# Patient Record
Sex: Female | Born: 1935 | Race: White | Hispanic: No | Smoking: Never smoker
Health system: Southern US, Community
[De-identification: ages and names within clinical notes are randomized; demographics above are authoritative.]

## PROBLEM LIST (undated history)

## (undated) DIAGNOSIS — I10 Essential (primary) hypertension: Secondary | ICD-10-CM

## (undated) DIAGNOSIS — I219 Acute myocardial infarction, unspecified: Secondary | ICD-10-CM

## (undated) DIAGNOSIS — K5090 Crohn's disease, unspecified, without complications: Secondary | ICD-10-CM

## (undated) DIAGNOSIS — K449 Diaphragmatic hernia without obstruction or gangrene: Secondary | ICD-10-CM

## (undated) DIAGNOSIS — J45909 Unspecified asthma, uncomplicated: Secondary | ICD-10-CM

## (undated) HISTORY — PX: ABDOMINAL HYSTERECTOMY: SHX81

## (undated) HISTORY — DX: Essential (primary) hypertension: I10

## (undated) HISTORY — DX: Crohn's disease, unspecified, without complications: K50.90

## (undated) HISTORY — DX: Unspecified asthma, uncomplicated: J45.909

## (undated) HISTORY — PX: APPENDECTOMY: SHX54

## (undated) HISTORY — PX: HERNIA REPAIR: SHX51

## (undated) HISTORY — DX: Diaphragmatic hernia without obstruction or gangrene: K44.9

---

## 1998-03-23 ENCOUNTER — Inpatient Hospital Stay (HOSPITAL_COMMUNITY): Admission: EM | Admit: 1998-03-23 | Discharge: 1998-03-25 | Payer: Self-pay | Admitting: Emergency Medicine

## 1998-03-25 ENCOUNTER — Encounter: Payer: Self-pay | Admitting: Cardiovascular Disease

## 1998-04-21 ENCOUNTER — Encounter: Payer: Self-pay | Admitting: Gastroenterology

## 1998-04-21 ENCOUNTER — Ambulatory Visit (HOSPITAL_COMMUNITY): Admission: RE | Admit: 1998-04-21 | Discharge: 1998-04-21 | Payer: Self-pay | Admitting: Gastroenterology

## 2000-04-21 ENCOUNTER — Ambulatory Visit (HOSPITAL_COMMUNITY): Admission: RE | Admit: 2000-04-21 | Discharge: 2000-04-21 | Payer: Self-pay | Admitting: Gastroenterology

## 2000-04-21 ENCOUNTER — Encounter: Payer: Self-pay | Admitting: Gastroenterology

## 2000-09-05 ENCOUNTER — Encounter: Payer: Self-pay | Admitting: Family Medicine

## 2000-09-05 ENCOUNTER — Encounter: Admission: RE | Admit: 2000-09-05 | Discharge: 2000-09-05 | Payer: Self-pay | Admitting: Family Medicine

## 2002-04-18 ENCOUNTER — Inpatient Hospital Stay (HOSPITAL_COMMUNITY): Admission: EM | Admit: 2002-04-18 | Discharge: 2002-04-21 | Payer: Self-pay | Admitting: Emergency Medicine

## 2002-04-18 ENCOUNTER — Encounter: Payer: Self-pay | Admitting: Internal Medicine

## 2002-04-18 ENCOUNTER — Encounter: Payer: Self-pay | Admitting: Emergency Medicine

## 2002-04-19 ENCOUNTER — Encounter (INDEPENDENT_AMBULATORY_CARE_PROVIDER_SITE_OTHER): Payer: Self-pay | Admitting: Specialist

## 2002-04-19 ENCOUNTER — Encounter: Payer: Self-pay | Admitting: Internal Medicine

## 2002-04-19 ENCOUNTER — Encounter (INDEPENDENT_AMBULATORY_CARE_PROVIDER_SITE_OTHER): Payer: Self-pay | Admitting: *Deleted

## 2002-04-19 DIAGNOSIS — K2990 Gastroduodenitis, unspecified, without bleeding: Secondary | ICD-10-CM

## 2002-04-19 DIAGNOSIS — K2980 Duodenitis without bleeding: Secondary | ICD-10-CM | POA: Insufficient documentation

## 2002-04-19 DIAGNOSIS — K2970 Gastritis, unspecified, without bleeding: Secondary | ICD-10-CM | POA: Insufficient documentation

## 2002-04-20 ENCOUNTER — Encounter: Payer: Self-pay | Admitting: Internal Medicine

## 2002-04-20 DIAGNOSIS — K5730 Diverticulosis of large intestine without perforation or abscess without bleeding: Secondary | ICD-10-CM | POA: Insufficient documentation

## 2002-04-20 DIAGNOSIS — K644 Residual hemorrhoidal skin tags: Secondary | ICD-10-CM | POA: Insufficient documentation

## 2002-04-21 ENCOUNTER — Encounter: Payer: Self-pay | Admitting: Internal Medicine

## 2002-09-14 ENCOUNTER — Encounter: Payer: Self-pay | Admitting: Family Medicine

## 2002-09-14 ENCOUNTER — Encounter: Admission: RE | Admit: 2002-09-14 | Discharge: 2002-09-14 | Payer: Self-pay | Admitting: Family Medicine

## 2002-09-22 ENCOUNTER — Encounter: Payer: Self-pay | Admitting: Cardiovascular Disease

## 2003-09-28 ENCOUNTER — Encounter: Admission: RE | Admit: 2003-09-28 | Discharge: 2003-09-28 | Payer: Self-pay | Admitting: Family Medicine

## 2004-08-28 ENCOUNTER — Ambulatory Visit: Payer: Self-pay | Admitting: Gastroenterology

## 2005-11-04 ENCOUNTER — Ambulatory Visit: Payer: Self-pay | Admitting: Gastroenterology

## 2006-11-24 ENCOUNTER — Ambulatory Visit: Payer: Self-pay | Admitting: Gastroenterology

## 2007-01-01 ENCOUNTER — Encounter: Admission: RE | Admit: 2007-01-01 | Discharge: 2007-01-01 | Payer: Self-pay | Admitting: Family Medicine

## 2007-11-18 ENCOUNTER — Ambulatory Visit: Payer: Self-pay | Admitting: Gastroenterology

## 2007-11-18 DIAGNOSIS — K5090 Crohn's disease, unspecified, without complications: Secondary | ICD-10-CM | POA: Insufficient documentation

## 2007-11-18 DIAGNOSIS — K222 Esophageal obstruction: Secondary | ICD-10-CM | POA: Insufficient documentation

## 2007-11-18 LAB — CONVERTED CEMR LAB
ALT: 14 units/L (ref 0–35)
AST: 17 units/L (ref 0–37)
Albumin: 3.8 g/dL (ref 3.5–5.2)
Alkaline Phosphatase: 74 units/L (ref 39–117)
Basophils Absolute: 0 10*3/uL (ref 0.0–0.1)
CO2: 30 meq/L (ref 19–32)
Chloride: 110 meq/L (ref 96–112)
Creatinine, Ser: 0.9 mg/dL (ref 0.4–1.2)
Eosinophils Absolute: 0.1 10*3/uL (ref 0.0–0.7)
Eosinophils Relative: 1.5 % (ref 0.0–5.0)
GFR calc non Af Amer: 65 mL/min
HCT: 40 % (ref 36.0–46.0)
MCHC: 33.5 g/dL (ref 30.0–36.0)
MCV: 87.2 fL (ref 78.0–100.0)
Neutro Abs: 4.9 10*3/uL (ref 1.4–7.7)
Platelets: 202 10*3/uL (ref 150–400)
RDW: 12.9 % (ref 11.5–14.6)
Sodium: 144 meq/L (ref 135–145)
Total Bilirubin: 0.7 mg/dL (ref 0.3–1.2)
Total Protein: 7.4 g/dL (ref 6.0–8.3)

## 2009-04-03 ENCOUNTER — Ambulatory Visit: Payer: Self-pay | Admitting: Gastroenterology

## 2010-05-14 ENCOUNTER — Ambulatory Visit: Payer: Self-pay | Admitting: Internal Medicine

## 2010-05-14 ENCOUNTER — Ambulatory Visit: Payer: Self-pay

## 2010-05-14 ENCOUNTER — Encounter: Payer: Self-pay | Admitting: Cardiovascular Disease

## 2010-05-14 ENCOUNTER — Ambulatory Visit (HOSPITAL_COMMUNITY)
Admission: RE | Admit: 2010-05-14 | Discharge: 2010-05-14 | Payer: Self-pay | Source: Home / Self Care | Admitting: Cardiovascular Disease

## 2010-11-27 NOTE — Assessment & Plan Note (Signed)
[facility]                         GASTROENTEROLOGY OFFICE NOTE   NAME:Varady, Sherrika W                          MRN:          [id number]  DATE:11/24/2006                            DOB:          1931/03/29    PROBLEM:  Crohn's disease.   Kerri Sosa has returned for an annual visit.  She has no GI complaints.  Specifically, she is without change of bowel habits or abdominal pain.  She has a history of esophageal stricture but denies dysphagia.   MEDICATIONS:  1. Prilosec.  2. Doxazosin.  3. Asacol 800 mg t.i.d.  4. Benicar.  5. Actonel.  6. Calcium.   ALLERGIES:  NO KNOWN DRUG ALLERGIES.   PHYSICAL EXAMINATION:  VITAL SIGNS:  Pulse 90, blood pressure 158/90,  weight 160.  HEENT:  EOMI. PERRLA. Sclerae are anicteric.  Conjunctivae are pink.  NECK:  Supple without thyromegaly, adenopathy or carotid bruits.  CHEST:  Clear to auscultation and percussion without adventitious  sounds.  CARDIAC:  Regular rhythm; normal S1 S2.  There are no murmurs, gallops  or rubs.  ABDOMEN:  Bowel sounds are normoactive.  Abdomen is soft, non-tender and  non-distended.  There are no abdominal masses, tenderness, splenic  enlargement or hepatomegaly.  EXTREMITIES:  Full range of motion.  No cyanosis, clubbing or edema.  RECTAL:  Deferred.   IMPRESSION:  1. Crohn's disease involving the terminal ileum - in remission.  2. History of esophageal stricture.   RECOMMENDATION:  Continue current medications.     Barbette Hair. Arlyce Dice, MD,FACG  Electronically Signed    RDK/MedQ  DD: 11/24/2006  DT: 11/24/2006  Job #: [id number]   cc:   Marjory Lies, M.D.

## 2010-11-30 NOTE — H&P (Signed)
NAME:  Kerri Sosa, Kerri Sosa                             ACCOUNT NO.:  [id number]   MEDICAL RECORD NO.:  [id number]                   PATIENT TYPE:  INP   LOCATION:  5735                                 FACILITY:  [facility]   PHYSICIAN:  Hedwig Morton. Juanda Sosa, M.D. [facility]            DATE OF BIRTH:  October 28, 1930   DATE OF ADMISSION:  04/18/2002  DATE OF DISCHARGE:                                HISTORY & PHYSICAL   CHIEF COMPLAINT:  Dizziness and weakness with microcytic anemia.   HISTORY OF PRESENT ILLNESS:  The patient is a pleasant 77 year old white  female who has about a nine year history of Crohn's disease, worked up by  Dr. Arlyce Dice in the past. She also has a history of hiatal hernia and what  sounds like esophageal strictures, as she has had a couple of occasions  where  she has had strictures dilated. She also has a history of anemia and  heme positive stools during an admission about four years ago. This is the  last time she had a colonoscopy, and at that time she also  had an upper  endoscopy. We do not have any of the prior admission records, nor do we have  any of the prior colonoscopy, endoscopy records at the time of this  dictation.   Crohn's disease wise, she does take Pentasa 250 mg, 12 a day, divided into  three doses. She also treats her Crohn's disease occasionally with p.r.n.  prednisone, which she had not done for about six months. When she started to  have lower abdominal cramping, which may or may not be associated with  increased frequency of loose stools, she takes 20 mg of prednisone and  tapers it rapidly off within a week.   Within the last three to four weeks, the patient has been having  progressive weakness and fatigue with activities that normally were not a  problem for  her, such as standing up to do the dishes or cleaning the  house. At times she has become presyncopal. Yesterday this occurred and her  family became alarmed. For the first time she also became queasy.  She was  not diaphoretic. The EMS was called and transported the patient to the  emergency room, where her blood pressure was 134/93, pulse of 70,  temperature 98.1, and hemoglobin importantly was 7.3 with an MCV of 61.8.  Cardiac enzymes and EKG were negative. Dr. Juanda Sosa admitted the patient and  ordered a transfusion of packed red blood cells, a total of  2 units. She  also set her up for an upper endoscopy this morning, which is to be  performed by Dr. Iva Boop.   ALLERGIES:  No known drug allergies.   CURRENT MEDICATIONS:  1. Pentasa 250 mg 4 pills q.i.d.  2. Univasc 15 mg 1 p.o. q.d.  3. Some form of combination antihypertensive, name and dose of which  she is     unclear, but she takes it once a day.  4. Vioxx p.r.n., about once a month. When she takes Vioxx she also takes     Nexium 40 mg at the same time, again only about once a month.   PAST MEDICAL HISTORY:  1. Crohn's disease, quiescent.  2. Hiatal hernia, GERD, question esophageal stricture.  3. Status post total abdominal hysterectomy when she was in her 75s     secondary to dysfunctional menstrual bleeding.  4. Status post right inguinal hernia repair.  5. Hypertension.  6. Degenerative joint disease.  7. History of anemia and heme positive stools about four years ago,     requiring transfusions.   FAMILY HISTORY:  No family history of inflammatory bowel  disease. No family  history or anemia or GI bleed.   SOCIAL HISTORY:  She has been a widow for 15 years. She has seven children,  many grandchildren and some great grandchildren. 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 She does not smoke or drink and never has. She lives in  [place].   REVIEW OF SYSTEMS:  NEUROLOGIC:  No confusion, no history of strokes,  no  focal weakness.  PULMONARY:  Sometimes she has a dry cough. She has had some  shortness of breath with this  recent fatigue but no history of COPD.  CARDIOVASCULAR:  No exertional angina. She does have some esophageal  burning, but that is  associated with eating when she gets filled up. GI:  Does describe some early satiety but no weight loss. No change in bowel  habits, no recent bloody bowel movements. Stools tend to be loose. GU: No  bladder incontinence. Urinates at night about two to three times.  MUSCULOSKELETAL:  Has some pain in her hips. In fact she has had cortisone  injections to the hips.  SKIN:  No history of skin cancers, rashes.  All  other systems were reviewed and were negative.   PHYSICAL EXAMINATION:  VITAL SIGNS:  Blood pressure 134/93,  pulse 70,  respirations 20, temperature 98.1.  GENERAL:  The patient is  a pleasant, pale appearing white female. She is  alert and oriented and provides a good history.  HEENT:  Sclerae anicteric. No conjunctival pallor. Extraocular movements  intact. Oropharynx, she has dentures above. A few teeth remain in her lower  jaw. Moist and pink mucous membranes.  NECK:  No JVD, thyromegaly or masses.  CHEST:  Clear to auscultation and percussion bilaterally. No cough with deep  inspiration.  COR:  There is a 1/6 systolic ejection murmur and the rate is rapid. No  gallops.  ABDOMEN:  Soft, nondistended, bowel sounds are active. There is some mild  tenderness in the right lower quadrant. No hepatosplenomegaly or masses. No  bruits.  RECTAL:  Per the P.A. in the emergency department is Guaiac negative brown  stool, no masses.  EXTREMITIES:  No cyanosis, clubbing or edema.  NEUROLOGIC:  She is alert and oriented x 3, no tremors. Grip is 5/5, pedal  strength is 5/5.  MUSCULOSKELETAL:  There are some degenerative changes in the fingers and  hands, but no gross musculoskeletal  deformities of the  spine or knees.  DERMATOLOGIC:  No spider telangiectasias on the trunk or upper extremities.  LABORATORY DATA:  Hemoglobin 7.3, hematocrit 24.3, MCV  61.8, platelet  334,000, WBC count 5.9.  Sed rate 22. Sodium 143, potassium 4.2, BUN 12,  creatinine 0.9, glucose 99. Total bilirubin  0.4, alkaline phosphatase 85,  AST 13, ALT 11, albumin  2.9. Iron low at 12, total iron binding capacity  normal at 336, iron saturation 4%, B12 normal 353. Serum folate level 12.4,  normal. CK 118, CK-MB 1.5, troponin I 0.01.   IMPRESSION:  1. Severe microcytic anemia, probably secondary to chronic GI blood loss,     likely due to Crohn's disease, but need to rule out peptic ulcer disease     or other upper GI mucosal abnormality. Arteriovenous malformations could     also cause chronic occult blood loss.  2. History of hiatal hernia and what sounds like an esophageal stricture. At     present she is not having  any symptomatic dysphagia but does have early     satiety.  3. Questionable history of peptic ulcer disease, at least 40 years ago.    PLAN:  The patient is to be upper endoscoped by Dr. Iva Boop. Also  ordered was a CT scan of the abdomen and pelvis with oral and IV contrast to  evaluate  the small and large  intestine, given her history of Crohn's  disease.  The patient will require iron supplementation. The patient also  may  require repeat colonoscopy. Plan to hold Vioxx and blood pressure  medications, although she has not been  using a lot of Vioxx.     Brett Canales, P.A. [facility]                    Kerri Sosa, M.D. [facility]    SG/MEDQ  D:  04/19/2002  T:  04/19/2002  Job:  [id number]

## 2010-11-30 NOTE — Procedures (Signed)
[facility], [place]  Patient:    Kerri Sosa, Kerri Sosa                      MRN: [id number] Proc. Date: 04/21/00 Adm. Date:  [id number] Attending:  Judeth Cornfield CC:         Marjory Lies, M.D.   Procedure Report  HISTORY:  Ms. Lambeth has a history of Crohns disease.  She also has a history of an esophageal stricture for which she was dilated two years ago. She has recurrent dysphagia.  INFORMED CONSENT:  The patient provided consent after the risks, benefits and alternatives were explained.  MEDICATIONS: Versed 3 mg, Vanceril 30 mg, Robinul 0.2 mg IV and Cetacaine spray.  PROCEDURE:  The patient was placed in the left lateral decubitus position and administered continuous low flow oxygen and was placed on pulse oximetry.  The Olympus video gastroscope was inserted under direct vision into the oropharynx and the esophagus.  FINDINGS:  Again noted at the GE junction was a fibrotic stricture.  The remainder of the exam was normal including the proximal esophagus, stomach and duodenum.  THERAPEUTICS:  With the scope in the distal stomach a  guide wire was placed through the scope and the scope was withdrawn.  Under fluoroscopic guidance #16, 17 and 18 mm Savary dilators were passed with resistance.  IMPRESSION:  Recurrent esophageal stricture.  RECOMMENDATION:  Repeat dilatation p.r.n. DD:  04/21/00 TD:  04/22/00 Job: [id number]

## 2010-11-30 NOTE — Discharge Summary (Signed)
Kerri Sosa, Kerri Sosa                             ACCOUNT NO.:  [id number]   MEDICAL RECORD NO.:  [id number]                   PATIENT TYPE:  INP   LOCATION:  5735                                 FACILITY:  [facility]   PHYSICIAN:  Teena Irani. Leone Payor, M.D.              DATE OF BIRTH:  10-18-1930   DATE OF ADMISSION:  04/18/2002  DATE OF DISCHARGE:                                 DISCHARGE SUMMARY   ADMISSION DIAGNOSES:  1. Severe microcytic anemia probably secondary to chronic gastrointestinal     blood loss associated with Crohn's disease, however, need to rule out     peptic ulcer disease or other upper gastrointestinal mucosal     abnormalities, rule out arteriovenous malformations as source of chronic     blood loss.  2. History of hiatal hernia and gastroesophageal reflux disease status post     dilations of the esophagus, not clear that she actually had stricture,     however.  3. Questionable history of peptic ulcer disease remotely approximately 40     years prior.  Rule out recurrence causing gastrointestinal bleed and     subsequent anemia.  4. Crohn's disease, generally asymptomatic with periodic diarrhea on stable     dose of Pentasa.  5. Status post total abdominal hysterectomy secondary to dysfunctional     menstrual bleeding when the patient was in her forties.  6. Status post right inguinal hernia repair.  7. Hypertension.  8. Degenerative joint disease.  9. Prior history of anemia with heme positive stools requiring transfusions     about four years previously.   DISCHARGE DIAGNOSES:  1. Gastrointestinal bleed secondary to [facility] erosions associated with     large hiatal hernia.  2. Anemia requiring transfusion with two units of packed red blood cells.     This is secondary to chronic gastrointestinal loss associated with     Sheria Lang erosions.  She is iron deficient with a low MCV.  3. History of Crohn's ileitis, stable on Pentasa.  Sedimentation rate is     normal  at 22.  4. Probable esophageal stricture.  The patient has intermittent dysmotility     and may benefit from future repeat dilatation of the esophagus.   CONSULTATIONS:  None.   PROCEDURE:  The patient underwent upper endoscopy on April 19, 2002 by Dr.  Stan Head.  He saw a partially obstructing stricture in the distal  esophagus which appeared benign.  Also encountered was a prolapsing hiatal  hernia with associated Cameron erosions and questionable paraesophageal  component.  Erosive gastritis in the body of the stomach noted, this in the  area of the large hiatal hernia.  Erosions were noted in the duodenum as  well.  CLOtest was performed and results were negative for Helicobacter  pylori.   BRIEF HISTORY:  The patient is a nice 77 year old white female with  about a  nine year history of Crohn's disease followed by Dr. Arlyce Dice.  She has had a  hiatal hernia and what sounds like esophageal strictures which have been  dilated in the past.  Previous colonoscopy and upper endoscopy had been at  least three years ago.  She takes a stable dose of Pentasa at 250 mg p.o.  t.i.d.  She also occasionally goes on very brief tapers of prednisone when  diarrhea flares and these tapers start at 20 mg of prednisone dialy and  taper rapidly to discontinuation over the next five to seven days.  Occasionally with these diarrheal flares she has some cramping lower  abdominal pain.  However, her stools have been stable and mostly formed  lately.  In the last four weeks prior to admission the patient was having  fatigue and weakness with normal activity such as doing her dishes or  cleaning the house.  She had had some pre-syncope as well.  On the day prior  to admission she became markedly pre-syncopal and the family became alarmed  and contacted EMS.  Blood pressure and pulse were stable.  When she arrived  in the emergency room her hemoglobin was 7.3 and MCV was low.  Cardiac  enzymes and EKG were  negative.  She was evaluated by Dr. Lina Sar who  ordered transfusion of two units of packed red blood cells and set her up  for endoscopy later that morning.   LABORATORY DATA:  Surgical pathology from the duodenum showed benign  duodenal mucosa and no active mucosal inflammation with some focal  superficial hemorrhages in the lamina propria.   Hemoglobin initially was 7.3 with a hematocrit of 24.3, MCV 61.8, platelets  334,000 and white blood cell count of 5.9.  Final hemoglobin was 9.4 and  hematocrit was 30.9.  Sedimentation rate was 22.  Sodium 143, potassium 4.2,  BUN 12, creatinine 0.9, glucose 99.  Albumin 2.9, total bilirubin 0.4,  alkaline phosphatase 85, AST 13, ALT 11.  CK was 118, CK MB 1.5, Troponin I  0.01.  Iron was low at 12, total iron binding capacity 336, iron saturation  4% which is low, B12 level 353, folate level 12.4.  I do not see a ferritin  level.  Urease testing for the presence of Helicobacter was negative.   RADIOLOGY:  A single view portable chest showed some mild cardiac  enlargement without acute pulmonary process.  CT scan of the abdomen and  pelvis showed a large hiatal hernia with a cyst in the right lobe of the  liver unchanged compared with 1994 CT scan.  The CT scan of the pelvis  showed bowel wall thickening affecting the distal ileum consistent with  Crohn's ileitis.  No complications noted.  There was diverticulosis without  evidence of diverticulitis as well.  Upper GI series showed a large hiatal  hernia without any paraesophageal component or volvulus.  Also seen was a  nonspecific esophageal dysmotility, but no esophageal stricture visualized.   HOSPITAL COURSE:  The patient was admitted to a floor bed. She received  transfusions times two units between the emergency room and arrival to the  floor.  Once he was transfused, serial hemoglobins and hematocrits were obtained and his hemoglobin and hematocrit remained stable.   Upper  endoscopy was performed by Dr. Leone Payor with findings noted above.  He  felt that he may have possibly reduced some of the hiatal hernia intubating  the patient with the endoscope and was concerned  that she may have a  paraesophageal component to her hiatal hernia.  He did find multiple Cameron  erosions in the sac of the hiatal hernia and was fairly certain that this  was the cause for her chronic GI blood loss and anemia.  The day following  her upper endoscopy the patient underwent upper GI series which indeed did  confirm a large hiatal hernia but no worrisome component such as  paraesophageal hernia oro volvulus.  Therefore, the current measures in  place for treatment, which included protein pump inhibitor therapy and  initiation of iron therapy were to be continued as an outpatient.   Regarding esophageal stricture, Dr. Leone Payor did not have equipment available  to safely dilate the esophagus at the time of the upper endoscopy, but he  does feel that the patient may benefit from attempted dilation of the  esophagus at a future point.  She has intermittent dysphagia, but also  recalled that the upper GI series showed that she had some component of  nonspecific esophageal dysmotility.   The patient's vital signs were stable throughout her hospitalization.  She  was very pleasant to deal with. After her upper endoscopy her diet was  advanced to solid foods which she tolerated without problem.  She was felt  stable for discharge on the afternoon of October 8 after completing the  upper GI series.   DISCHARGE MEDICATIONS:  1. Pentasa 240 mg four p.o. t.i.d.  2. Univasc 15 mg one p.o. q.d.  3. Doxazosin 4 mg one p.o. q.d.  4. Vioxx (dose is not known) as needed one p.o. q.d.  5. New medications include Prilosec 20 mg one p.o. q.d. which she is to     start after she finishes up her existing supply of Nexium 40 mg which she     is to begin taking on a daily basis rather than on a p.r.n.  basis; iron     sulfate 325 mg one p.o. b.i.d. and multivitamin senior type formulation     one p.o. q.d.   FOLLOW UP:  1. A return office visit is made for the patient on Friday, November 14 at     1:30 p.m. with Dr. Arlyce Dice.  She or her family are to call the office if     she is having problems prior to her GI tract before this.  2. Follow-up with Dr. Doristine Counter at [facility] who is her     primary care physician and is to see her for any other medical issues.     Brett Canales, P.A. [facility]                    Teena Irani. Leone Payor, M.D.    SG/MEDQ  D:  04/21/2002  T:  04/24/2002  Job:  [id number]   cc:   Marjory Lies, MD   Teena Irani. Arlyce Dice, M.D.

## 2011-08-29 ENCOUNTER — Telehealth: Payer: Self-pay | Admitting: Cardiovascular Disease

## 2011-08-29 NOTE — Telephone Encounter (Signed)
Kerri Sosa with dr Lyn Hollingshead morris's office needs to know if pt needs an abx before dental appt may be tooth extraction, pls fax response to [facility] @ [phone]

## 2011-08-29 NOTE — Telephone Encounter (Signed)
Faxed reply no abx needed per Dr Elease Hashimoto

## 2011-11-20 ENCOUNTER — Encounter: Payer: Self-pay | Admitting: *Deleted

## 2011-11-20 DIAGNOSIS — K449 Diaphragmatic hernia without obstruction or gangrene: Secondary | ICD-10-CM | POA: Insufficient documentation

## 2012-01-01 ENCOUNTER — Other Ambulatory Visit: Payer: Self-pay | Admitting: Family Medicine

## 2012-01-01 DIAGNOSIS — Z1231 Encounter for screening mammogram for malignant neoplasm of breast: Secondary | ICD-10-CM

## 2012-01-09 ENCOUNTER — Other Ambulatory Visit: Payer: Self-pay | Admitting: Cardiovascular Disease

## 2012-01-22 ENCOUNTER — Ambulatory Visit
Admission: RE | Admit: 2012-01-22 | Discharge: 2012-01-22 | Disposition: A | Discharge status: Home / Self Care | Payer: Medicare Other | Source: Ambulatory Visit | Attending: Family Medicine | Admitting: Family Medicine

## 2012-01-22 DIAGNOSIS — Z1231 Encounter for screening mammogram for malignant neoplasm of breast: Secondary | ICD-10-CM

## 2012-06-25 ENCOUNTER — Ambulatory Visit (INDEPENDENT_AMBULATORY_CARE_PROVIDER_SITE_OTHER): Payer: Medicare Other | Admitting: Cardiovascular Disease

## 2012-06-25 ENCOUNTER — Encounter: Payer: Self-pay | Admitting: Cardiovascular Disease

## 2012-06-25 VITALS — BP 132/90 | HR 79 | Ht 64.0 in | Wt 159.4 lb

## 2012-06-25 DIAGNOSIS — I350 Nonrheumatic aortic (valve) stenosis: Secondary | ICD-10-CM

## 2012-06-25 DIAGNOSIS — I359 Nonrheumatic aortic valve disorder, unspecified: Secondary | ICD-10-CM

## 2012-06-25 NOTE — Assessment & Plan Note (Signed)
Kerri Sosa presents for further evaluation and management of her aortic stenosis. She has no specific symptoms of aortic stenosis. She does have some dyspnea but I don't think that it is related to her aortic stenosis. She denies any syncope or chest pain.  We'll get a repeat echocardiogram this week. Her last echo was in 2011. I anticipate seeing her again in one year for followup office visit.  I have asked her to get exercise on a regular basis.

## 2012-06-25 NOTE — Patient Instructions (Addendum)
Your physician has requested that you have an echocardiogram today if can. Echocardiography is a painless test that uses sound waves to create images of your heart. It provides your doctor with information about the size and shape of your heart and how well your heart's chambers and valves are working. This procedure takes approximately one hour. There are no restrictions for this procedure.  Your physician wants you to follow-up in:1 year  You will receive a reminder letter in the mail two months in advance. If you don't receive a letter, please call our office to schedule the follow-up appointment.

## 2012-06-25 NOTE — Progress Notes (Signed)
    Kerri Sosa Date of Birth  December 15, 1930       [facility]    [place] [address]  [address] [place], [place]  [address]   [place], [place]  [address] [phone]     [phone]   Fax  [phone]    Fax [phone]  Problem List: 1. Dyspnea 2. Aortic stenosis - mild 3. Mitral regurgitation  History of Present Illness:  Kerri Sosa is a 78 yo with hx of mild AS and MR.  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  She does not get any regular exercise.  She denies any syncopal episodes. She denies any chest pain.  Current Outpatient Prescriptions on File Prior to Visit  Medication Sig Dispense Refill  . amLODipine-valsartan (EXFORGE) 5-160 MG per tablet Take 1 tablet by mouth daily.      . Calcium Carbonate-Vitamin D (CALCIUM + D PO) Take 600 mg by mouth daily.      Marland Kitchen omeprazole (PRILOSEC) 20 MG capsule Take 20 mg by mouth daily.      . moexipril (UNIVASC) 15 MG tablet Take 15 mg by mouth at bedtime.        No Known Allergies  Past Medical History  Diagnosis Date  . Hiatal hernia   . Crohn's disease   . Hypertension     Past Surgical History  Procedure Date  . Hernia repair   . Appendectomy     History  Smoking status  . Never Smoker   Smokeless tobacco  . Not on file    History  Alcohol Use No    Family History  Problem Relation Age of Onset  . Heart disease Mother   . Cancer Father     Reviw of Systems:  Reviewed in the HPI.  All other systems are negative.  Physical Exam: Blood pressure 132/90, pulse 79, height 5\' 4"  (1.626 m), weight 159 lb 6.4 oz (72.303 kg), SpO2 94.00%. General: Well developed, well nourished, in no acute distress.  Head: Normocephalic, atraumatic, sclera non-icteric, mucus membranes are moist,   Neck: Supple. Carotids are 2 + .  There is radiation of his systolic murmur up into his right carotid-versus a right carotid bruit.  No JVD   Lungs: Clear   Heart: RR, 2/6  systolic murmur  Abdomen: Soft, non-tender, non-distended with normal bowel sounds.  Msk:  Strength and tone are normal   Extremities: No clubbing or cyanosis. No edema.  Distal pedal pulses are 2+ and equal    Neuro: CN II - XII intact.  Alert and oriented X 3.   Psych:  Normal   ECG: June 25, 2012-normal sinus rhythm at 65 beats a minute. She has occasional premature atrial contractions. She has voltage for left ventricular hypertrophy.  Assessment / Plan:

## 2012-07-17 ENCOUNTER — Ambulatory Visit (HOSPITAL_COMMUNITY): Payer: Medicare Other | Attending: Cardiovascular Disease

## 2012-07-17 DIAGNOSIS — I10 Essential (primary) hypertension: Secondary | ICD-10-CM | POA: Insufficient documentation

## 2012-07-17 DIAGNOSIS — I350 Nonrheumatic aortic (valve) stenosis: Secondary | ICD-10-CM

## 2012-07-17 DIAGNOSIS — I369 Nonrheumatic tricuspid valve disorder, unspecified: Secondary | ICD-10-CM | POA: Insufficient documentation

## 2012-07-17 DIAGNOSIS — I359 Nonrheumatic aortic valve disorder, unspecified: Secondary | ICD-10-CM

## 2012-07-17 DIAGNOSIS — I059 Rheumatic mitral valve disease, unspecified: Secondary | ICD-10-CM | POA: Insufficient documentation

## 2012-07-17 DIAGNOSIS — I379 Nonrheumatic pulmonary valve disorder, unspecified: Secondary | ICD-10-CM | POA: Insufficient documentation

## 2012-07-17 NOTE — Progress Notes (Signed)
Echocardiogram performed.  

## 2012-12-02 ENCOUNTER — Other Ambulatory Visit: Payer: Self-pay | Admitting: Family Medicine

## 2012-12-02 DIAGNOSIS — Z1231 Encounter for screening mammogram for malignant neoplasm of breast: Secondary | ICD-10-CM

## 2012-12-02 DIAGNOSIS — M810 Age-related osteoporosis without current pathological fracture: Secondary | ICD-10-CM

## 2013-01-26 ENCOUNTER — Ambulatory Visit
Admission: RE | Admit: 2013-01-26 | Discharge: 2013-01-26 | Disposition: A | Discharge status: Home / Self Care | Payer: Medicare Other | Source: Ambulatory Visit | Attending: Family Medicine | Admitting: Family Medicine

## 2013-01-26 DIAGNOSIS — M810 Age-related osteoporosis without current pathological fracture: Secondary | ICD-10-CM

## 2013-01-26 DIAGNOSIS — Z1231 Encounter for screening mammogram for malignant neoplasm of breast: Secondary | ICD-10-CM

## 2013-02-22 ENCOUNTER — Telehealth: Payer: Self-pay | Admitting: Cardiovascular Disease

## 2013-02-22 NOTE — Telephone Encounter (Signed)
New Problem  Pt was out of town and had a heart attack// She received a cath and they put in a stent/// pt wants an appointment as soon as possible// Daughter will have notes routed to [place].

## 2013-02-22 NOTE — Telephone Encounter (Signed)
Pt is still in the hospital and is being discharged today, pt has app set here by the hospital attending/ offered an earlier app i [facility] but she declined at this time, she will wait till she is at home and will call back if sooner app needed.

## 2013-03-11 ENCOUNTER — Telehealth: Payer: Self-pay | Admitting: *Deleted

## 2013-03-11 NOTE — Telephone Encounter (Signed)
Kerri Sosa FROM DR MORRIS OFFICE CALLED RE PT.   PT IS IN CHAIR  IN NEED   OF CLEANING  AND  FILLING  PT JUST HAD  MI  WITH  2 STENTS PER DAUGHTER  IN [place]  DISCUSSED WITH LAUREN RECOMMENDATIONS ARE TO WAIT 6 WEEKS  IF APPT  IS  ROUTINE  TO RESCHEDULE . DR MORRIS  AWARE OF ABOVE PT WILL BE RESCHEDULED FOR 6 WEEKS OUT FROM  MI./CY

## 2013-03-11 NOTE — Telephone Encounter (Signed)
NOTED ./CY 

## 2013-03-11 NOTE — Telephone Encounter (Signed)
Kerri Sosa had an MI 2 weeks ago and was seen in [facility].  I do not have any details about the MI or stent procedure.

## 2013-04-07 ENCOUNTER — Ambulatory Visit (INDEPENDENT_AMBULATORY_CARE_PROVIDER_SITE_OTHER): Payer: Medicare Other | Admitting: Cardiovascular Disease

## 2013-04-07 ENCOUNTER — Telehealth: Payer: Self-pay | Admitting: *Deleted

## 2013-04-07 ENCOUNTER — Encounter: Payer: Self-pay | Admitting: Cardiovascular Disease

## 2013-04-07 VITALS — BP 150/75 | HR 47 | Ht 64.0 in | Wt 147.0 lb

## 2013-04-07 DIAGNOSIS — R748 Abnormal levels of other serum enzymes: Secondary | ICD-10-CM

## 2013-04-07 DIAGNOSIS — I350 Nonrheumatic aortic (valve) stenosis: Secondary | ICD-10-CM

## 2013-04-07 DIAGNOSIS — I359 Nonrheumatic aortic valve disorder, unspecified: Secondary | ICD-10-CM

## 2013-04-07 DIAGNOSIS — I10 Essential (primary) hypertension: Secondary | ICD-10-CM

## 2013-04-07 DIAGNOSIS — E785 Hyperlipidemia, unspecified: Secondary | ICD-10-CM

## 2013-04-07 DIAGNOSIS — I2510 Atherosclerotic heart disease of native coronary artery without angina pectoris: Secondary | ICD-10-CM

## 2013-04-07 LAB — HEPATIC FUNCTION PANEL: Total Bilirubin: 0.7 mg/dL (ref 0.3–1.2)

## 2013-04-07 LAB — BASIC METABOLIC PANEL
BUN: 15 mg/dL (ref 6–23)
CO2: 28 mEq/L (ref 19–32)
Calcium: 9 mg/dL (ref 8.4–10.5)
Creatinine, Ser: 0.9 mg/dL (ref 0.4–1.2)
GFR: 64.85 mL/min (ref >60.00)
Glucose, Bld: 91 mg/dL (ref 70–99)

## 2013-04-07 LAB — LIPID PANEL
HDL: 67.5 mg/dL (ref >39.00)
LDL Cholesterol: 50 mg/dL (ref 0–99)
VLDL: 11 mg/dL (ref 0.0–40.0)

## 2013-04-07 NOTE — Assessment & Plan Note (Signed)
She was just recently diagnosed with coronary artery disease in August. Her LDL goal is now 43. She has been placed on Lipitor. We'll check fasting labs today.

## 2013-04-07 NOTE — Assessment & Plan Note (Signed)
Blood pressures typically normal when she measures it at home. We'll continue to follow this.

## 2013-04-07 NOTE — Patient Instructions (Addendum)
Your physician recommends that you return for lab work in: TODAY BMET LIPID LIVER  Your physician wants you to follow-up in: 6 MONTHS WITH EKG  You will receive a reminder letter in the mail two months in advance. If you don't receive a letter, please call our office to schedule the follow-up appointment.  Your physician recommends that you return for a FASTING lipid profile: 6 MONTHS

## 2013-04-07 NOTE — Assessment & Plan Note (Signed)
Her aortic stenosis is stable. She is a peak to peak gradient of 7 mm of mercury by cardiac catheterization on 02/19/2013.

## 2013-04-07 NOTE — Telephone Encounter (Signed)
Labs were reviewed// Pt to stop lipitor and tylenol/ LFT in 1 month, app given/ 05/07/13, pt verbalized understanding.

## 2013-04-07 NOTE — Progress Notes (Signed)
Kerri Sosa Date of Birth  12-24-1930       [facility]    [place] [address]  [address], [address] [place], [place]  [address]   [place], [place]  [address] [phone]     [phone]   Fax  [phone]    Fax [phone]  Problem List: 1. Dyspnea 2. Aortic stenosis - mild , for cardiac catheterization in August, 2014 revealed a peak to peak aortic valve gradient of 7 mmHg. In the past her anastomosis has been graded as mild using echo. 3. Mitral regurgitation 4. CAD - s/p Inf. STEMI, s/p BMS stenting of her mid RCA. 4.0 stent dilated to 5.0 mm. ( Aug. 8, 2014) 5. Hypertension  History of Present Illness:  Kerri Sosa is a 79 yo with hx of mild AS and MR.  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  She does not get any regular exercise.  She denies any syncopal episodes. She denies any chest pain.  Sept. 24, 2014:  Kerri Sosa was out of town and had an inferior wall ST segment elevation myocardial infarction.  She was obviously black [facility] and started having episodes of midsternal pain.   She was transferred to [facility] and taken to [facility]. She had an urgent cardiac catheterization which revealed an occluded mid right coronary artery.   She had a 4.0 x 20 mm bare-metal stent placed in her mid right coronary artery. He was post dilated up to 5.0 mm with a noncompliant balloon.  She is doing well at this point.   No CP.  She still has some dyspnea.   She's eager to get back to work. She's not had any   severe complications.  She measures her blood pressure on regular basis and her readings are typically in the normal range.  Current Outpatient Prescriptions on File Prior to Visit  Medication Sig Dispense Refill  . amLODipine-valsartan (EXFORGE) 5-160 MG per tablet Take 1 tablet by mouth daily.      . Calcium Carbonate-Vitamin D (CALCIUM + D PO) Take 600 mg by mouth daily.      Marland Kitchen omeprazole (PRILOSEC) 20 MG  capsule Take 20 mg by mouth daily.      . Multiple Vitamins-Minerals (MULTIVITAMIN PO) Take by mouth daily.       No current facility-administered medications on file prior to visit.    No Known Allergies  Past Medical History  Diagnosis Date  . Hiatal hernia   . Crohn's disease   . Hypertension     Past Surgical History  Procedure Laterality Date  . Hernia repair    . Appendectomy      History  Smoking status  . Never Smoker   Smokeless tobacco  . Not on file    History  Alcohol Use No    Family History  Problem Relation Age of Onset  . Heart disease Mother   . Cancer Father     Reviw of Systems:  Reviewed in the HPI.  All other systems are negative.  Physical Exam: Blood pressure 180/88, pulse 47, height 5\' 4"  (1.626 m), weight 147 lb (66.679 kg). General: Well developed, well nourished, in no acute distress.  Head: Normocephalic, atraumatic, sclera non-icteric, mucus membranes are moist,   Neck: Supple. Carotids are 2 + .  There is radiation of his systolic murmur up into his right carotid-versus a right carotid bruit.  No JVD  Lungs: Clear   Heart: RR, 2/6 systolic murmur  Abdomen: Soft, non-tender, non-distended with normal bowel sounds.  Msk:  Strength and tone are normal   Extremities: No clubbing or cyanosis. No edema.  Distal pedal pulses are 2+ and equal    Neuro: CN II - XII intact.  Alert and oriented X 3.   Psych:  Normal   ECG: Sept. 24, 2014:  Sinus brady at 47, occas. PACs.   Assessment / Plan:

## 2013-05-07 ENCOUNTER — Other Ambulatory Visit (INDEPENDENT_AMBULATORY_CARE_PROVIDER_SITE_OTHER): Payer: Medicare Other

## 2013-05-07 DIAGNOSIS — R748 Abnormal levels of other serum enzymes: Secondary | ICD-10-CM

## 2013-05-07 LAB — HEPATIC FUNCTION PANEL
AST: 19 U/L (ref 0–37)
Alkaline Phosphatase: 117 U/L (ref 39–117)
Bilirubin, Direct: 0.1 mg/dL (ref 0.0–0.3)
Total Bilirubin: 0.5 mg/dL (ref 0.3–1.2)
Total Protein: 7.2 g/dL (ref 6.0–8.3)

## 2013-05-11 ENCOUNTER — Telehealth: Payer: Self-pay | Admitting: Nurse Practitioner

## 2013-05-11 DIAGNOSIS — E785 Hyperlipidemia, unspecified: Secondary | ICD-10-CM

## 2013-05-11 NOTE — Telephone Encounter (Signed)
Message copied by Levi Aland on Tue May 11, 2013  4:37 PM ------      Message from: Vesta Mixer      Created: Tue May 11, 2013  4:18 PM       Her liver enzymes have returned to normal.  It's not clear to me if the liver elevations are due to the lipitor another offending medication.            OK to restart back Lipitor at 20 mg a day.  Check fasting lipids and HFP each month for 4 months. ------

## 2013-05-11 NOTE — Telephone Encounter (Signed)
I reviewed Dr. Harvie Bridge advice with patient who verbalized understanding. Patient will resume Lipitor 20 mg daily.  Patient is scheduled for lab appointments Nov, Dec, Jan, Feb and orders have been placed in Epic.

## 2013-05-25 ENCOUNTER — Telehealth: Payer: Self-pay | Admitting: *Deleted

## 2013-05-25 NOTE — Telephone Encounter (Signed)
Message copied by Antony Odea on Tue May 25, 2013 11:59 AM ------      Message from: Vesta Mixer      Created: Fri May 07, 2013  6:10 PM       Labs are great       ------

## 2013-05-25 NOTE — Telephone Encounter (Signed)
Pt already has monthly lab dates set up. msg left to disregard last msg.

## 2013-05-25 NOTE — Telephone Encounter (Signed)
Called pt to confirm she is taking lipitor and to get her in sooner for lab draw for liver enzymes// needed in 3 weeks.  Asked pt to call back, number provided.

## 2013-06-08 ENCOUNTER — Other Ambulatory Visit (INDEPENDENT_AMBULATORY_CARE_PROVIDER_SITE_OTHER): Payer: Medicare Other

## 2013-06-08 DIAGNOSIS — I350 Nonrheumatic aortic (valve) stenosis: Secondary | ICD-10-CM

## 2013-06-08 DIAGNOSIS — E785 Hyperlipidemia, unspecified: Secondary | ICD-10-CM

## 2013-06-08 DIAGNOSIS — I2510 Atherosclerotic heart disease of native coronary artery without angina pectoris: Secondary | ICD-10-CM

## 2013-06-08 DIAGNOSIS — I359 Nonrheumatic aortic valve disorder, unspecified: Secondary | ICD-10-CM

## 2013-06-08 DIAGNOSIS — I10 Essential (primary) hypertension: Secondary | ICD-10-CM

## 2013-06-08 LAB — LIPID PANEL
Cholesterol: 140 mg/dL (ref 0–200)
HDL: 85.9 mg/dL (ref >39.00)
LDL Cholesterol: 46 mg/dL (ref 0–99)
Total CHOL/HDL Ratio: 2
Triglycerides: 40 mg/dL (ref 0.0–149.0)
VLDL: 8 mg/dL (ref 0.0–40.0)

## 2013-06-08 LAB — BASIC METABOLIC PANEL
BUN: 19 mg/dL (ref 6–23)
CO2: 26 mEq/L (ref 19–32)
Calcium: 9 mg/dL (ref 8.4–10.5)
Chloride: 108 mEq/L (ref 96–112)
Creatinine, Ser: 0.9 mg/dL (ref 0.4–1.2)
Glucose, Bld: 87 mg/dL (ref 70–99)

## 2013-06-08 LAB — HEPATIC FUNCTION PANEL
ALT: 73 U/L — ABNORMAL HIGH (ref 0–35)
AST: 62 U/L — ABNORMAL HIGH (ref 0–37)
Albumin: 3.4 g/dL — ABNORMAL LOW (ref 3.5–5.2)
Alkaline Phosphatase: 301 U/L — ABNORMAL HIGH (ref 39–117)
Bilirubin, Direct: 0.1 mg/dL (ref 0.0–0.3)
Total Bilirubin: 0.6 mg/dL (ref 0.3–1.2)
Total Protein: 7.3 g/dL (ref 6.0–8.3)

## 2013-06-09 ENCOUNTER — Telehealth: Payer: Self-pay | Admitting: *Deleted

## 2013-06-09 NOTE — Telephone Encounter (Signed)
Message copied by Antony Odea on Wed Jun 09, 2013  2:04 PM ------      Message from: [place], [place] J      Created: Tue Jun 08, 2013  7:17 PM       Liver enzymes are back up - she will not be able to tolerate the Lipitor.  DC lipitor and recheck liver enzymes in 1 months.       ------

## 2013-06-09 NOTE — Telephone Encounter (Signed)
1 MO LAB DRAW MADE AND HAS OV IN 1WEEK.

## 2013-06-14 NOTE — Telephone Encounter (Signed)
lipitor was stopped/ enzymes elevated again, 1 month lab date given. Pt verbalized understanding.

## 2013-06-17 ENCOUNTER — Ambulatory Visit (INDEPENDENT_AMBULATORY_CARE_PROVIDER_SITE_OTHER): Payer: Medicare Other | Admitting: Cardiovascular Disease

## 2013-06-17 ENCOUNTER — Encounter: Payer: Self-pay | Admitting: Cardiovascular Disease

## 2013-06-17 VITALS — BP 124/80 | HR 57 | Ht 64.0 in | Wt 152.6 lb

## 2013-06-17 DIAGNOSIS — I350 Nonrheumatic aortic (valve) stenosis: Secondary | ICD-10-CM

## 2013-06-17 DIAGNOSIS — E785 Hyperlipidemia, unspecified: Secondary | ICD-10-CM

## 2013-06-17 DIAGNOSIS — I359 Nonrheumatic aortic valve disorder, unspecified: Secondary | ICD-10-CM

## 2013-06-17 DIAGNOSIS — I2510 Atherosclerotic heart disease of native coronary artery without angina pectoris: Secondary | ICD-10-CM

## 2013-06-17 DIAGNOSIS — I10 Essential (primary) hypertension: Secondary | ICD-10-CM

## 2013-06-17 NOTE — Assessment & Plan Note (Addendum)
Her aortic  stenosis is mild and is stable. We do not need a repeat echocardiogram yet.

## 2013-06-17 NOTE — Progress Notes (Signed)
Kerri Sosa Date of Birth  1930-11-23       [facility]    [place] [address]  [address] [place], [place]  [address]   [place], [place]  [address] [phone]     [phone]   Fax  [phone]    Fax [phone]  Problem List: 1. Dyspnea 2. Aortic stenosis - mild , for cardiac catheterization in August, 2014 revealed a peak to peak aortic valve gradient of 7 mmHg. In the past her anastomosis has been graded as mild using echo. 3. Mitral regurgitation 4. CAD - s/p Inf. STEMI, s/p BMS stenting of her mid RCA. 4.0 stent dilated to 5.0 mm. ( Aug. 8, 2014) 5. Hypertension  History of Present Illness:  Kerri Sosa is a 79 yo with hx of mild AS and MR.  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  She does not get any regular exercise.  She denies any syncopal episodes. She denies any chest pain.  Sept. 24, 2014:  Kerri Sosa was out of town and had an inferior wall ST segment elevation myocardial infarction.  She was obviously black [facility] and started having episodes of midsternal pain.   She was transferred to [facility] and taken to [facility]. She had an urgent cardiac catheterization which revealed an occluded mid right coronary artery.   She had a 4.0 x 20 mm bare-metal stent placed in her mid right coronary artery. He was post dilated up to 5.0 mm with a noncompliant balloon.  She is doing well at this point.   No CP.  She still has some dyspnea.   She's eager to get back to work. She's not had any   severe complications.  She measures her blood pressure on regular basis and her readings are typically in the normal range.  Dec. 4, 2014:  No cardiac symptoms.  Has had a head cold.    Current Outpatient Prescriptions on File Prior to Visit  Medication Sig Dispense Refill  . amLODipine-valsartan (EXFORGE) 5-160 MG per tablet Take 1 tablet by mouth daily.      Marland Kitchen aspirin 81 MG tablet Take 81 mg by mouth daily.       . Calcium Carbonate-Vitamin D (CALCIUM + D PO) Take 600 mg by mouth daily.      . clopidogrel (PLAVIX) 75 MG tablet Take 75 mg by mouth daily.       . metoprolol tartrate (LOPRESSOR) 25 MG tablet Take 25 mg by mouth 2 (two) times daily.       Marland Kitchen NITROSTAT 0.4 MG SL tablet every 5 (five) minutes as needed.       Marland Kitchen omeprazole (PRILOSEC) 20 MG capsule Take 20 mg by mouth daily.       No current facility-administered medications on file prior to visit.    Allergies  Allergen Reactions  . Lipitor [Atorvastatin] Other (See Comments)    ELEVATES LIVER ENZYMES    Past Medical History  Diagnosis Date  . Hiatal hernia   . Crohn's disease   . Hypertension     Past Surgical History  Procedure Laterality Date  . Hernia repair    . Appendectomy      History  Smoking status  . Never Smoker   Smokeless tobacco  . Not on file    History  Alcohol Use No    Family History  Problem Relation Age of Onset  . Heart disease  Mother   . Cancer Father     Reviw of Systems:  Reviewed in the HPI.  All other systems are negative.  Physical Exam: Blood pressure 124/80, pulse 57, height 5\' 4"  (1.626 m), weight 152 lb 9.6 oz (69.219 kg). General: Well developed, well nourished, in no acute distress.  Head: Normocephalic, atraumatic, sclera non-icteric, mucus membranes are moist,   Neck: Supple. Carotids are 2 + .  There is radiation of his systolic murmur up into his right carotid-versus a right carotid bruit.  No JVD   Lungs: Clear   Heart: RR, 2/6 systolic murmur  Abdomen: Soft, non-tender, non-distended with normal bowel sounds.  Msk:  Strength and tone are normal   Extremities: No clubbing or cyanosis. No edema.  Distal pedal pulses are 2+ and equal    Neuro: CN II - XII intact.  Alert and oriented X 3.   Psych:  Normal   ECG: Dec. 4, 2014:  Sinus brady at 57,  PVCs   Assessment / Plan:

## 2013-06-17 NOTE — Assessment & Plan Note (Signed)
She's not having any angina. Continue with her same medications. We discussed the fact that she may want to continue the Plavix and aspirin indefinitely given recent data that  suggests that patients who remain on dual antiplatelet therapy do better than those were just on aspirin alone.  She's not having any bleeding issues.

## 2013-06-17 NOTE — Assessment & Plan Note (Signed)
We tried statins were 2 different occasions. On both occasions, she's had significant liver and some elevations. We will allow her liver enzymes to return back down to normal. Anticipate starting her on Zetia 10 mg once her liver enzymes are normal.

## 2013-06-17 NOTE — Patient Instructions (Addendum)
We plan on starting you on Zetia 10  once your liver enzymes are back to normal.    Your physician wants you to follow-up in: 6 months You will receive a reminder letter in the mail two months in advance. If you don't receive a letter, please call our office to schedule the follow-up appointment.   Your physician recommends that you continue on your current medications as directed. Please refer to the Current Medication list given to you today.

## 2013-07-06 ENCOUNTER — Other Ambulatory Visit (INDEPENDENT_AMBULATORY_CARE_PROVIDER_SITE_OTHER): Payer: Medicare Other

## 2013-07-06 DIAGNOSIS — E785 Hyperlipidemia, unspecified: Secondary | ICD-10-CM

## 2013-07-06 LAB — LIPID PANEL
LDL Cholesterol: 80 mg/dL (ref 0–99)
Triglycerides: 122 mg/dL (ref 0.0–149.0)
VLDL: 24.4 mg/dL (ref 0.0–40.0)

## 2013-07-06 LAB — HEPATIC FUNCTION PANEL
ALT: 17 U/L (ref 0–35)
Albumin: 3.5 g/dL (ref 3.5–5.2)
Alkaline Phosphatase: 109 U/L (ref 39–117)
Bilirubin, Direct: 0 mg/dL (ref 0.0–0.3)
Total Bilirubin: 0.4 mg/dL (ref 0.3–1.2)

## 2013-08-06 ENCOUNTER — Other Ambulatory Visit: Payer: Medicare Other

## 2013-09-06 ENCOUNTER — Other Ambulatory Visit: Payer: Medicare Other

## 2013-12-14 ENCOUNTER — Encounter: Payer: Self-pay | Admitting: Cardiovascular Disease

## 2013-12-14 ENCOUNTER — Encounter (INDEPENDENT_AMBULATORY_CARE_PROVIDER_SITE_OTHER): Payer: Self-pay

## 2013-12-14 ENCOUNTER — Ambulatory Visit (INDEPENDENT_AMBULATORY_CARE_PROVIDER_SITE_OTHER): Payer: Medicare Other | Admitting: Cardiovascular Disease

## 2013-12-14 VITALS — BP 130/90 | HR 69 | Ht 64.0 in | Wt 151.0 lb

## 2013-12-14 DIAGNOSIS — E785 Hyperlipidemia, unspecified: Secondary | ICD-10-CM

## 2013-12-14 DIAGNOSIS — I10 Essential (primary) hypertension: Secondary | ICD-10-CM

## 2013-12-14 DIAGNOSIS — I2510 Atherosclerotic heart disease of native coronary artery without angina pectoris: Secondary | ICD-10-CM

## 2013-12-14 LAB — HEPATIC FUNCTION PANEL
ALK PHOS: 81 U/L (ref 39–117)
ALT: 16 U/L (ref 0–35)
AST: 18 U/L (ref 0–37)
Albumin: 3.6 g/dL (ref 3.5–5.2)
BILIRUBIN DIRECT: 0 mg/dL (ref 0.0–0.3)
BILIRUBIN TOTAL: 0.7 mg/dL (ref 0.2–1.2)
Total Protein: 7.1 g/dL (ref 6.0–8.3)

## 2013-12-14 LAB — LIPID PANEL
CHOLESTEROL: 185 mg/dL (ref 0–200)
HDL: 65.5 mg/dL (ref >39.00)
LDL Cholesterol: 106 mg/dL — ABNORMAL HIGH (ref 0–99)
Total CHOL/HDL Ratio: 3
Triglycerides: 66 mg/dL (ref 0.0–149.0)
VLDL: 13.2 mg/dL (ref 0.0–40.0)

## 2013-12-14 LAB — BASIC METABOLIC PANEL
BUN: 19 mg/dL (ref 6–23)
CALCIUM: 9.3 mg/dL (ref 8.4–10.5)
CO2: 27 mEq/L (ref 19–32)
CREATININE: 1 mg/dL (ref 0.4–1.2)
Chloride: 107 mEq/L (ref 96–112)
GFR: 60.04 mL/min (ref >60.00)
GLUCOSE: 84 mg/dL (ref 70–99)
POTASSIUM: 4.3 meq/L (ref 3.5–5.1)
Sodium: 140 mEq/L (ref 135–145)

## 2013-12-14 NOTE — Assessment & Plan Note (Signed)
Her BP is ok.  Diastolic is borderline high.  Dr. Doristine Counter wondered if we should increase the amlodipine to 10.  I think we should have her record her BP for 6 months and review.  If the diastolic BP is elevated, I would add HCTZ and KCl.  Continue same meds  for now

## 2013-12-14 NOTE — Assessment & Plan Note (Signed)
Stable.  No symptoms.  Will continue to follow clinically.  Will repeat echo in several years - sooner if she has any symptoms.

## 2013-12-14 NOTE — Progress Notes (Signed)
Kerri RickerAnn W Sosa Date of Birth  May 14, 1931       [facility]    [place] [facility] [address]  [address] [place], [place]  [address]   [place], [place]  [address] [phone]     [phone]   Fax  [phone]    Fax [phone]  Problem List: 1. Dyspnea 2. Aortic stenosis - mild , for cardiac catheterization in August, 2014 revealed a peak to peak aortic valve gradient of 7 mmHg. In the past her Aortic stenosis has been graded as mild using echo. 3. Mitral regurgitation 4. CAD - s/p Inf. STEMI, s/p BMS stenting of her mid RCA. 4.0 stent dilated to 5.0 mm. ( Aug. 8, 2014) 5. Hypertension  History of Present Illness:  Kerri Sosa is a 80 yo with hx of mild AS and MR.  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  She does not get any regular exercise.  She denies any syncopal episodes. She denies any chest pain.  Sept. 24, 2014:  Kerri Sosa was out of town and had an inferior wall ST segment elevation myocardial infarction.  [place]  [facility] [place] [facility] [place] and started having episodes of midsternal pain.   [place] [facility] [place] [facility] and [facility]. She had an urgent cardiac catheterization which revealed an occluded mid right coronary artery.   She had a 4.0 x 20 mm bare-metal stent placed in her mid right coronary artery. He was post dilated up to 5.0 mm with a noncompliant balloon.  She is doing well at this point.   No CP.  She still has some dyspnea.   She's eager to get back to work. She's not had any   severe complications.  She measures her blood pressure on regular basis and her readings are typically in the normal range.  Dec. 4, 2014:  No cardiac symptoms.  Has had a head cold.    December 14, 2013:  Kerri Sosa is doing well.   She is going to [place] next [place]. No angina pain. 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    No CP, dyspnea, or presyncope.    Current Outpatient Prescriptions on File Prior to Visit  Medication Sig Dispense Refill  . amLODipine-valsartan (EXFORGE) 5-160 MG per tablet Take 1 tablet by mouth daily.      Marland Kitchen. aspirin 81 MG tablet Take 81 mg by mouth daily.      . Calcium Carbonate-Vitamin D (CALCIUM + D PO) Take 600 mg by mouth daily.      . clopidogrel (PLAVIX) 75 MG tablet Take 75 mg by mouth daily.       . metoprolol tartrate (LOPRESSOR) 25 MG tablet Take 25 mg by mouth 2 (two) times daily.       Marland Kitchen. omeprazole (PRILOSEC) 20 MG capsule Take 20 mg by mouth every other day.       Marland Kitchen. NITROSTAT 0.4 MG SL tablet every 5 (five) minutes as needed.        No current facility-administered medications on file prior to visit.    Allergies  Allergen Reactions  . Lipitor [Atorvastatin] Other (See Comments)    ELEVATES LIVER ENZYMES    Past Medical History  Diagnosis Date  . Hiatal hernia   . Crohn's disease   . Hypertension     Past  Surgical History  Procedure Laterality Date  . Hernia repair    . Appendectomy      History  Smoking status  . Never Smoker   Smokeless tobacco  . Not on file    History  Alcohol Use No    Family History  Problem Relation Age of Onset  . Heart disease Mother   . Cancer Father     Reviw of Systems:  Reviewed in the HPI.  All other systems are negative.  Physical Exam: Blood pressure 130/90, pulse 69, height 5\' 4"  (1.626 m), weight 151 lb (68.493 kg), SpO2 91.00%. General: Well developed, well nourished, in no acute distress.  Head: Normocephalic, atraumatic, sclera non-icteric, mucus membranes are moist,   Neck: Supple. Carotids are 2 + .  There is radiation of his systolic murmur up into his right carotid-versus a right carotid bruit.  No JVD   Lungs: Clear   Heart: RR, 2/6 systolic murmur  Abdomen: Soft, non-tender, non-distended with normal bowel sounds.  Msk:  Strength and tone are normal   Extremities: No clubbing or  cyanosis. No edema.  Distal pedal pulses are 2+ and equal    Neuro: CN II - XII intact.  Alert and oriented X 3.   Psych:  Normal   ECG: Dec. 4, 2014:  Sinus brady at 57,  PVCs   Assessment / Plan:

## 2013-12-14 NOTE — Patient Instructions (Signed)
Your physician recommends that you have lab work: TODAY  Your physician recommends that you continue on your current medications as directed. Please refer to the Current Medication list given to you today.  Your physician wants you to follow-up in: 6 months with Dr. Elease Hashimoto.  You will receive a reminder letter in the mail two months in advance. If you don't receive a letter, please call our office to schedule the follow-up appointment.  Your physician recommends that you return for lab work in: 6 months on the day of or a few days before your office visit with Dr. Elease Hashimoto.  You will need to FAST for this appointment - nothing to eat or drink after midnight the night before except water.

## 2013-12-14 NOTE — Assessment & Plan Note (Signed)
No angina.  Will check lipids, liver, BMP today and again in 6 months.

## 2013-12-15 ENCOUNTER — Telehealth: Payer: Self-pay | Admitting: Cardiovascular Disease

## 2013-12-15 DIAGNOSIS — E785 Hyperlipidemia, unspecified: Secondary | ICD-10-CM

## 2013-12-15 MED ORDER — PRAVASTATIN SODIUM 40 MG PO TABS: 40.0000 mg | ORAL_TABLET | Freq: Every evening | ORAL | Status: DC

## 2013-12-15 NOTE — Telephone Encounter (Signed)
Reviewed lab results with patient who verbalized understanding and agreement to start Pravachol 40 mg once daily.  Patient is scheduled for repeat fasting labs on September 8.  I advised patient to call back with questions or concerns in the meantime.

## 2013-12-15 NOTE — Telephone Encounter (Signed)
Patient is returning your call. Please call back.  °

## 2014-03-23 ENCOUNTER — Other Ambulatory Visit (INDEPENDENT_AMBULATORY_CARE_PROVIDER_SITE_OTHER): Payer: Medicare Other

## 2014-03-23 DIAGNOSIS — E785 Hyperlipidemia, unspecified: Secondary | ICD-10-CM

## 2014-03-23 LAB — BASIC METABOLIC PANEL
BUN: 19 mg/dL (ref 6–23)
CO2: 27 mEq/L (ref 19–32)
Calcium: 9.1 mg/dL (ref 8.4–10.5)
Chloride: 108 mEq/L (ref 96–112)
Creatinine, Ser: 0.9 mg/dL (ref 0.4–1.2)
GFR: 60.74 mL/min (ref >60.00)
GLUCOSE: 87 mg/dL (ref 70–99)
Potassium: 3.9 mEq/L (ref 3.5–5.1)
Sodium: 142 mEq/L (ref 135–145)

## 2014-03-23 LAB — LIPID PANEL
Cholesterol: 141 mg/dL (ref 0–200)
HDL: 66.3 mg/dL (ref >39.00)
LDL CALC: 61 mg/dL (ref 0–99)
NonHDL: 74.7
Total CHOL/HDL Ratio: 2
Triglycerides: 71 mg/dL (ref 0.0–149.0)
VLDL: 14.2 mg/dL (ref 0.0–40.0)

## 2014-03-23 LAB — HEPATIC FUNCTION PANEL
ALBUMIN: 3.3 g/dL — AB (ref 3.5–5.2)
ALT: 12 U/L (ref 0–35)
AST: 18 U/L (ref 0–37)
Alkaline Phosphatase: 77 U/L (ref 39–117)
BILIRUBIN TOTAL: 0.5 mg/dL (ref 0.2–1.2)
Bilirubin, Direct: 0 mg/dL (ref 0.0–0.3)
Total Protein: 7.1 g/dL (ref 6.0–8.3)

## 2014-05-31 ENCOUNTER — Ambulatory Visit (INDEPENDENT_AMBULATORY_CARE_PROVIDER_SITE_OTHER): Payer: Medicare Other | Admitting: Cardiovascular Disease

## 2014-05-31 ENCOUNTER — Encounter: Payer: Self-pay | Admitting: Cardiovascular Disease

## 2014-05-31 VITALS — BP 160/92 | HR 63 | Ht 64.0 in | Wt 156.4 lb

## 2014-05-31 DIAGNOSIS — I2510 Atherosclerotic heart disease of native coronary artery without angina pectoris: Secondary | ICD-10-CM

## 2014-05-31 DIAGNOSIS — I350 Nonrheumatic aortic (valve) stenosis: Secondary | ICD-10-CM

## 2014-05-31 MED ORDER — NITROGLYCERIN 0.4 MG SL SUBL: 0.4000 mg | SUBLINGUAL_TABLET | SUBLINGUAL | Status: DC | PRN

## 2014-05-31 NOTE — Progress Notes (Signed)
Kerri RickerAnn W Sosa Date of Birth  11/04/1930       [facility]    [place] [facility] [address]  [address] [place], [place]  [address]   [place]  [id number] [phone]     [phone]   Fax  [phone]    Fax [phone]  Problem List: 1. Dyspnea 2. Aortic stenosis - mild , for cardiac catheterization in August, 2014 revealed a peak to peak aortic valve gradient of 7 mmHg. In the past her Aortic stenosis has been graded as mild using echo. 3. Mitral regurgitation 4. CAD - s/p Inf. STEMI, s/p BMS stenting of her mid RCA. 4.0 stent dilated to 5.0 mm. ( Aug. 8, 2014) 5. Hypertension  History of Present Illness:  Kerri Sosa is a 80 yo with hx of mild AS and MR.  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  She does not get any regular exercise.  She denies any syncopal episodes. She denies any chest pain.  Sept. 24, 2014:  Kerri Sosa was out of town and had an inferior wall ST segment elevation myocardial infarction.  [facility] [place]  [facility] [place] and started having episodes of midsternal pain.   [place] was [place] [facility] and [facility]. She had an urgent cardiac catheterization which revealed an occluded mid right coronary artery.   She had a 4.0 x 20 mm bare-metal stent placed in her mid right coronary artery. He was post dilated up to 5.0 mm with a noncompliant balloon.  She is doing well at this point.   No CP.  She still has some dyspnea.   She's eager to get back to work. She's not had any   severe complications.  She measures her blood pressure on regular basis and her readings are typically in the normal range.  Dec. 4, 2014:  No cardiac symptoms.  Has had a head cold.    December 14, 2013:  Kerri Sosa is doing well.   She is going to [place] next [place]. No angina pain. 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    No CP, dyspnea, or presyncope.  Nov. 17 ,2015:  Kerri Sosa is a 80 y.o. female who I follow for her CAD ( [place]) and her aortic stenosis.   Kerri Sosa is doing well.  Still works at her church.  Goes up and down stairs without problems. Occasionally out of breath at the top of the stairs.  No Cp , no dizziness.    BP has been normal at home.  A bit elevated today.   Current Outpatient Prescriptions on File Prior to Visit  Medication Sig Dispense Refill  . acetaminophen (TYLENOL) 325 MG tablet Take 325 mg by mouth every 6 (six) hours as needed.    Marland Kitchen. amLODipine-valsartan (EXFORGE) 5-160 MG per tablet Take 1 tablet by mouth daily.    Marland Kitchen. aspirin 81 MG tablet Take 81 mg by mouth daily.    . Calcium Carbonate-Vitamin D (CALCIUM + D PO) Take 600 mg by mouth daily.    . clopidogrel (PLAVIX) 75 MG tablet Take 75 mg by mouth daily.     . metoprolol tartrate (LOPRESSOR) 25 MG tablet Take 25 mg by mouth 2 (two) times daily.     Marland Kitchen. NITROSTAT 0.4 MG SL tablet every  5 (five) minutes as needed.     Marland Kitchen omeprazole (PRILOSEC) 20 MG capsule Take 20 mg by mouth every other day.     . pravastatin (PRAVACHOL) 40 MG tablet Take 1 tablet (40 mg total) by mouth every evening. 90 tablet 3   No current facility-administered medications on file prior to visit.    Allergies  Allergen Reactions  . Lipitor [Atorvastatin] Other (See Comments)    ELEVATES LIVER ENZYMES    Past Medical History  Diagnosis Date  . Hiatal hernia   . Crohn's disease   . Hypertension     Past Surgical History  Procedure Laterality Date  . Hernia repair    . Appendectomy      History  Smoking status  . Never Smoker   Smokeless tobacco  . Not on file    History  Alcohol Use No    Family History  Problem Relation Age of Onset  . Heart disease Mother   . Cancer Father     Reviw of Systems:  Reviewed in the HPI.  All other systems are negative.  Physical Exam: Blood pressure 160/92, pulse 63,  height 5\' 4"  (1.626 m), weight 156 lb 6.4 oz (70.943 kg). General: Well developed, well nourished, in no acute distress.  Head: Normocephalic, atraumatic, sclera non-icteric, mucus membranes are moist,   Neck: Supple. Carotids are 2 + .  There is radiation of his systolic murmur up into his right carotid-versus a right carotid bruit.  No JVD   Lungs: Clear   Heart: RR, 2/6 systolic murmur  Abdomen: Soft, non-tender, non-distended with normal bowel sounds.  Msk:  Strength and tone are normal   Extremities: No clubbing or cyanosis. No edema.  Distal pedal pulses are 2+ and equal    Neuro: CN II - XII intact.  Alert and oriented X 3.   Psych:  Normal   ECG: Nov. 17, 2015:  NSR at 63, occasional PAC, voltage for LVH  Assessment / Plan:

## 2014-05-31 NOTE — Patient Instructions (Signed)
Your physician recommends that you continue on your current medications as directed. Please refer to the Current Medication list given to you today.  Your physician wants you to follow-up in: 6 months with Dr. Nahser.  You will receive a reminder letter in the mail two months in advance. If you don't receive a letter, please call our office to schedule the follow-up appointment.  

## 2014-05-31 NOTE — Assessment & Plan Note (Signed)
She has mild aortic stenosis by echo . Will consider repeating echo in a year or so

## 2014-05-31 NOTE — Addendum Note (Signed)
Addended by: Levi Aland on: 05/31/2014 11:57 AM   Modules accepted: Orders

## 2014-05-31 NOTE — Assessment & Plan Note (Signed)
Kerri Sosa  is doing very well. She's not had any episodes of angina. Continue same medications. Her lipid levels are very well controlled. Continue current dose of pravastatin.  i will see her in 6 months

## 2014-06-08 ENCOUNTER — Other Ambulatory Visit: Payer: Self-pay | Admitting: *Deleted

## 2014-06-08 MED ORDER — NITROGLYCERIN 0.4 MG SL SUBL: 0.4000 mg | SUBLINGUAL_TABLET | SUBLINGUAL | Status: DC | PRN

## 2014-11-28 ENCOUNTER — Ambulatory Visit (INDEPENDENT_AMBULATORY_CARE_PROVIDER_SITE_OTHER): Payer: Medicare Other | Admitting: Cardiovascular Disease

## 2014-11-28 ENCOUNTER — Encounter: Payer: Self-pay | Admitting: Cardiovascular Disease

## 2014-11-28 VITALS — BP 166/82 | HR 53 | Ht 64.0 in | Wt 158.0 lb

## 2014-11-28 DIAGNOSIS — I350 Nonrheumatic aortic (valve) stenosis: Secondary | ICD-10-CM

## 2014-11-28 DIAGNOSIS — I2510 Atherosclerotic heart disease of native coronary artery without angina pectoris: Secondary | ICD-10-CM

## 2014-11-28 NOTE — Patient Instructions (Signed)

## 2014-11-28 NOTE — Progress Notes (Signed)
Cardiology Office Note   Date:  11/28/2014   ID:  [name], [name] [birth year], MRN [id number]  PCP:  Delorse Lek, MD  Cardiologist:   Vesta Mixer, MD   Chief Complaint  Patient presents with  . Coronary Artery Disease   1. Dyspnea 2. Aortic stenosis - mild , for cardiac catheterization in August, 2014 revealed a peak to peak aortic valve gradient of 7 mmHg. In the past her Aortic stenosis has been graded as mild using echo. 3. Mitral regurgitation 4. CAD - s/p Inf. STEMI, s/p BMS stenting of her mid RCA. 4.0 stent dilated to 5.0 mm. ( Aug. 8, 2014) 5. Hypertension  History of Present Illness:  Kerri Sosa is a 81 yo with hx of mild AS and MR. 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 She does not get any regular exercise.  She denies any syncopal episodes. She denies any chest pain.  Sept. 24, 2014:  Kerri Sosa was out of town and had an inferior wall ST segment elevation myocardial infarction. She was on vacation at [facility] and started having episodes of midsternal pain. She was transferred to [facility] and taken to [facility]. She had an urgent cardiac catheterization which revealed an occluded mid right coronary artery. She had a 4.0 x 20 mm bare-metal stent placed in her mid right coronary artery. He was post dilated up to 5.0 mm with a noncompliant balloon.  She is doing well at this point. No CP. She still has some dyspnea.   She's eager to get back to work. She's not had any severe complications. She measures her blood pressure on regular basis and her readings are typically in the normal range.  Dec. 4, 2014:  No cardiac symptoms. Has had a head cold.   December 14, 2013:  [name] is doing well. She is going to visit her daughter at [facility] next week. No angina pain. 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   No CP, dyspnea, or presyncope.  Nov. 17  ,2015:  Kerri Sosa is a 81 y.o. female who I follow for her CAD ( MI while in [place], [place] Aug. 2014) and her aortic stenosis.   Tiffney is doing well. Still works at her church. Goes up and down stairs without problems. Occasionally out of breath at the top of the stairs.  No Cp , no dizziness.   BP has been normal at home.  A bit elevated today.    Nov 28 2014:   KALEESE Sosa is a 81 y.o. female who presents for follow up of CAD No cp BP is a bit elevated at home. BP at home is normal .       Past Medical History  Diagnosis Date  . Hiatal hernia   . Crohn's disease   . Hypertension     Past Surgical History  Procedure Laterality Date  . Hernia repair    . Appendectomy       Current Outpatient Prescriptions  Medication Sig Dispense Refill  . acetaminophen (TYLENOL) 325 MG tablet Take 325 mg by mouth every 6 (six) hours as needed.    Marland Kitchen amLODipine-valsartan (EXFORGE) 5-160 MG per tablet Take 1 tablet by mouth daily.    Marland Kitchen aspirin 81 MG tablet Take 81 mg by mouth daily.    . Calcium Carbonate-Vitamin D (CALCIUM + D PO) Take 600 mg by mouth daily.    Marland Kitchen  clopidogrel (PLAVIX) 75 MG tablet Take 75 mg by mouth daily.     . metoprolol tartrate (LOPRESSOR) 25 MG tablet Take 25 mg by mouth 2 (two) times daily.     . nitroGLYCERIN (NITROSTAT) 0.4 MG SL tablet Place 1 tablet (0.4 mg total) under the tongue every 5 (five) minutes as needed (Do not exceed 3 pills at one time). 25 tablet 6  . omeprazole (PRILOSEC) 20 MG capsule Take 20 mg by mouth every other day.     . pravastatin (PRAVACHOL) 40 MG tablet Take 1 tablet (40 mg total) by mouth every evening. 90 tablet 3   No current facility-administered medications for this visit.    Allergies:   Lipitor    Social History:  The patient  reports that she has never smoked. She does not have any smokeless tobacco history on file. She reports that she does not drink alcohol.   Family History:  The patient's family history includes Cancer  in her father; Heart disease in her mother.    ROS:  Please see the history of present illness.    Review of Systems: Constitutional:  denies fever, chills, diaphoresis, appetite change and fatigue.  HEENT: denies photophobia, eye pain, redness, hearing loss, ear pain, congestion, sore throat, rhinorrhea, sneezing, neck pain, neck stiffness and tinnitus.  Respiratory: denies SOB, DOE, cough, chest tightness, and wheezing.  Cardiovascular: denies chest pain, palpitations and leg swelling.  Gastrointestinal: denies nausea, vomiting, abdominal pain, diarrhea, constipation, blood in stool.  Genitourinary: denies dysuria, urgency, frequency, hematuria, flank pain and difficulty urinating.  Musculoskeletal: denies  myalgias, back pain, joint swelling, arthralgias and gait problem.   Skin: denies pallor, rash and wound.  Neurological: denies dizziness, seizures, syncope, weakness, light-headedness, numbness and headaches.   Hematological: denies adenopathy, easy bruising, personal or family bleeding history.  Psychiatric/ Behavioral: denies suicidal ideation, mood changes, confusion, nervousness, sleep disturbance and agitation.       All other systems are reviewed and negative.    PHYSICAL EXAM: VS:  BP 166/82 mmHg  Pulse 53  Ht  (1.626 m)  Wt 71.668 kg (158 lb)  BMI 27.11 kg/m2 , BMI Body mass index is 27.11 kg/(m^2). GEN: Well nourished, well developed, in no acute distress HEENT: normal Neck: no JVD, carotid bruits, or masses Cardiac: RRR; no murmurs, rubs, or gallops,no edema  Respiratory:  clear to auscultation bilaterally, normal work of breathing GI: soft, nontender, nondistended, + BS MS: no deformity or atrophy Skin: warm and dry, no rash Neuro:  Strength and sensation are intact Psych: normal   EKG:  EKG is not ordered today.    Recent Labs: 03/23/2014: ALT 12; BUN 19; Creatinine 0.9; Potassium 3.9; Sodium 142    Lipid Panel    Component Value Date/Time    CHOL 141 03/23/2014 0742   TRIG 71.0 03/23/2014 0742   HDL 66.30 03/23/2014 0742   CHOLHDL 2 03/23/2014 0742   VLDL 14.2 03/23/2014 0742   LDLCALC 61 03/23/2014 0742      Wt Readings from Last 3 Encounters:  11/28/14 71.668 kg (158 lb)  05/31/14 70.943 kg (156 lb 6.4 oz)  12/14/13 68.493 kg (151 lb)      Other studies Reviewed: Additional studies/ records that were reviewed today include: . Review of the above records demonstrates:    ASSESSMENT AND PLAN:  1. Dyspnea 2. Aortic stenosis - mild , for cardiac catheterization in August, 2014 revealed a peak to peak aortic valve gradient of 7 mmHg. In the  past her Aortic stenosis has been graded as mild using echo.  She remains asymptomatic.   3. Mitral regurgitation 4. CAD - s/p Inf. STEMI, s/p BMS stenting of her mid RCA. 4.0 stent dilated to 5.0 mm. ( Aug. 8, 2014).  No angina   5. Hypertension -  BP is well controlled Typically.   Will have her record her BP at home   Current medicines are reviewed at length with the patient today.  The patient does not have concerns regarding medicines.  The following changes have been made:  no change  Labs/ tests ordered today include:  No orders of the defined types were placed in this encounter.     Disposition:   FU with me in 6 months .      Stefana Lodico, Deloris Ping, MD  11/28/2014 3:44 PM    [facility] [address] [place], [place], [place]  [address] Phone: [phone]; Fax: [phone]   [facility]  [address] [place], [place]  [address] [phone]    Fax [phone]

## 2014-12-10 ENCOUNTER — Other Ambulatory Visit: Payer: Self-pay | Admitting: Cardiovascular Disease

## 2014-12-13 NOTE — Telephone Encounter (Signed)
Per note 5.16.16 

## 2015-05-29 ENCOUNTER — Encounter: Payer: Self-pay | Admitting: Cardiovascular Disease

## 2015-05-29 ENCOUNTER — Ambulatory Visit (INDEPENDENT_AMBULATORY_CARE_PROVIDER_SITE_OTHER): Payer: Medicare Other | Admitting: Cardiovascular Disease

## 2015-05-29 ENCOUNTER — Other Ambulatory Visit (INDEPENDENT_AMBULATORY_CARE_PROVIDER_SITE_OTHER): Payer: Medicare Other | Admitting: *Deleted

## 2015-05-29 VITALS — BP 158/94 | HR 63 | Ht 64.0 in | Wt 160.0 lb

## 2015-05-29 DIAGNOSIS — I2510 Atherosclerotic heart disease of native coronary artery without angina pectoris: Secondary | ICD-10-CM

## 2015-05-29 DIAGNOSIS — E785 Hyperlipidemia, unspecified: Secondary | ICD-10-CM

## 2015-05-29 DIAGNOSIS — I10 Essential (primary) hypertension: Secondary | ICD-10-CM | POA: Diagnosis not present

## 2015-05-29 LAB — BASIC METABOLIC PANEL
BUN: 14 mg/dL (ref 7–25)
CALCIUM: 9.3 mg/dL (ref 8.6–10.4)
CO2: 25 mmol/L (ref 20–31)
CREATININE: 0.87 mg/dL (ref 0.60–0.88)
Chloride: 108 mmol/L (ref 98–110)
GLUCOSE: 84 mg/dL (ref 65–99)
Potassium: 4.6 mmol/L (ref 3.5–5.3)
SODIUM: 143 mmol/L (ref 135–146)

## 2015-05-29 LAB — LIPID PANEL
Cholesterol: 141 mg/dL (ref 125–200)
HDL: 73 mg/dL (ref >=46)
LDL CALC: 56 mg/dL (ref <130)
Total CHOL/HDL Ratio: 1.9 Ratio (ref <=5.0)
Triglycerides: 62 mg/dL (ref <150)
VLDL: 12 mg/dL (ref <30)

## 2015-05-29 LAB — HEPATIC FUNCTION PANEL
ALBUMIN: 3.6 g/dL (ref 3.6–5.1)
ALK PHOS: 87 U/L (ref 33–130)
ALT: 12 U/L (ref 6–29)
AST: 21 U/L (ref 10–35)
Bilirubin, Direct: 0.1 mg/dL (ref <=0.2)
Indirect Bilirubin: 0.3 mg/dL (ref 0.2–1.2)
TOTAL PROTEIN: 6.7 g/dL (ref 6.1–8.1)
Total Bilirubin: 0.4 mg/dL (ref 0.2–1.2)

## 2015-05-29 MED ORDER — METOPROLOL TARTRATE 25 MG PO TABS: 25.0000 mg | ORAL_TABLET | Freq: Two times a day (BID) | ORAL | Status: DC

## 2015-05-29 MED ORDER — AMLODIPINE BESYLATE-VALSARTAN 5-160 MG PO TABS: 1.0000 | ORAL_TABLET | Freq: Every day | ORAL | Status: DC

## 2015-05-29 MED ORDER — CLOPIDOGREL BISULFATE 75 MG PO TABS: 75.0000 mg | ORAL_TABLET | Freq: Every day | ORAL | Status: DC

## 2015-05-29 NOTE — Patient Instructions (Addendum)
Medication Instructions:  STOP Omeprazole due to interaction with Plavix - you can take Pepcid OTC   Labwork: TODAY - cholesterol, liver, basic metabolic panel  Your physician recommends that you return for lab work in: 6 months on the day of or a few days before your office visit with Dr. Elease Hashimoto.  You will need to FAST for this appointment - nothing to eat or drink after midnight the night before except water.   Testing/Procedures: None Ordered   Follow-Up: Your physician wants you to follow-up in: 6 months with Dr. Elease Hashimoto.  You will receive a reminder letter in the mail two months in advance. If you don't receive a letter, please call our office to schedule the follow-up appointment.  If you need a refill on your cardiac medications before your next appointment, please call your pharmacy.   Thank you for choosing [facility]! Eligha Bridegroom, RN [phone]

## 2015-05-29 NOTE — Progress Notes (Signed)
Cardiology Office Note   Date:  05/29/2015   ID:  Kerri, Sosa 14-Nov-1930, MRN [id number]  PCP:  Delorse Lek, MD  Cardiologist:   Vesta Mixer, MD   Chief Complaint  Patient presents with  . Coronary Artery Disease   1. Dyspnea 2. Aortic stenosis - mild , for cardiac catheterization in August, 2014 revealed a peak to peak aortic valve gradient of 7 mmHg. In the past her Aortic stenosis has been graded as mild using echo. 3. Mitral regurgitation 4. CAD - s/p Inf. STEMI, s/p BMS stenting of her mid RCA. 4.0 stent dilated to 5.0 mm. ( Aug. 8, 2014) 5. Hypertension  History of Present Illness:  Kerri Sosa is a 81 yo with hx of mild AS and MR. 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 She does not get any regular exercise.  She denies any syncopal episodes. She denies any chest pain.  Sept. 24, 2014:  Colleena was out of town and had an inferior wall ST segment elevation myocardial infarction. She was on vacation at [facility] and started having episodes of midsternal pain. She was transferred to [facility] and taken to [facility]. She had an urgent cardiac catheterization which revealed an occluded mid right coronary artery. She had a 4.0 x 20 mm bare-metal stent placed in her mid right coronary artery. He was post dilated up to 5.0 mm with a noncompliant balloon.  She is doing well at this point. No CP. She still has some dyspnea.   She's eager to get back to work. She's not had any severe complications. She measures her blood pressure on regular basis and her readings are typically in the normal range.  Dec. 4, 2014:  No cardiac symptoms. Has had a head cold.   December 14, 2013:  Kerri Sosa is doing well. She is going to visit her daughter at [facility] next week. No angina pain. 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   No CP, dyspnea, or presyncope.  Nov. 17  ,2015:  Mireille is a 81 y.o. female who I follow for her CAD ( MI while in [place], [place] Aug. 2014) and her aortic stenosis.   Kerri Sosa is doing well. Still works at her church. Goes up and down stairs without problems. Occasionally out of breath at the top of the stairs.  No Cp , no dizziness.   BP has been normal at home.  A bit elevated today.    Nov 28 2014:   Kerri Sosa is a 81 y.o. female who presents for follow up of CAD No cp BP is a bit elevated at home. BP at home is normal .     Nov. 14, 2016:  Able to do all of her normal activities. Just got [place] the [place].    No CP or dyspnea.  BP is here in the office.  Was normal at home .  She watches her salt .   Past Medical History  Diagnosis Date  . Hiatal hernia   . Crohn's disease (HCC)   . Hypertension     Past Surgical History  Procedure Laterality Date  . Hernia repair    . Appendectomy       Current Outpatient Prescriptions  Medication Sig Dispense Refill  . acetaminophen (TYLENOL) 325 MG tablet Take 325 mg by mouth every 6 (six) hours as needed.    Marland Kitchen  amLODipine-valsartan (EXFORGE) 5-160 MG per tablet Take 1 tablet by mouth daily.    Marland Kitchen aspirin 81 MG tablet Take 81 mg by mouth daily.    . Calcium Carbonate-Vitamin D (CALCIUM + D PO) Take 600 mg by mouth daily.    . clopidogrel (PLAVIX) 75 MG tablet Take 75 mg by mouth daily.     . metoprolol tartrate (LOPRESSOR) 25 MG tablet Take 25 mg by mouth 2 (two) times daily.     . nitroGLYCERIN (NITROSTAT) 0.4 MG SL tablet Place 1 tablet (0.4 mg total) under the tongue every 5 (five) minutes as needed (Do not exceed 3 pills at one time). 25 tablet 6  . omeprazole (PRILOSEC) 20 MG capsule Take 20 mg by mouth every other day.     . pravastatin (PRAVACHOL) 40 MG tablet TAKE 1 TABLET (40 MG TOTAL) BY MOUTH EVERY EVENING. 90 tablet 3   No current facility-administered medications for this visit.    Allergies:   Lipitor    Social History:  The patient   reports that she has never smoked. She does not have any smokeless tobacco history on file. She reports that she does not drink alcohol.   Family History:  The patient's family history includes Cancer in her father; Heart disease in her mother.    ROS:  Please see the history of present illness.    Review of Systems: Constitutional:  denies fever, chills, diaphoresis, appetite change and fatigue.  HEENT: denies photophobia, eye pain, redness, hearing loss, ear pain, congestion, sore throat, rhinorrhea, sneezing, neck pain, neck stiffness and tinnitus.  Respiratory: denies SOB, DOE, cough, chest tightness, and wheezing.  Cardiovascular: denies chest pain, palpitations and leg swelling.  Gastrointestinal: denies nausea, vomiting, abdominal pain, diarrhea, constipation, blood in stool.  Genitourinary: denies dysuria, urgency, frequency, hematuria, flank pain and difficulty urinating.  Musculoskeletal: denies  myalgias, back pain, joint swelling, arthralgias and gait problem.   Skin: denies pallor, rash and wound.  Neurological: denies dizziness, seizures, syncope, weakness, light-headedness, numbness and headaches.   Hematological: denies adenopathy, easy bruising, personal or family bleeding history.  Psychiatric/ Behavioral: denies suicidal ideation, mood changes, confusion, nervousness, sleep disturbance and agitation.       All other systems are reviewed and negative.    PHYSICAL EXAM: VS:  BP 158/94 mmHg  Pulse 63  Ht  (1.626 m)  Wt 160 lb (72.576 kg)  BMI 27.45 kg/m2 , BMI Body mass index is 27.45 kg/(m^2). GEN: Well nourished, well developed, in no acute distress HEENT: normal Neck: no JVD, carotid bruits, or masses Cardiac: Regularly irregular in a bigeminal pattern.    no edema  Respiratory:  clear to auscultation bilaterally, normal work of breathing GI: soft, nontender, nondistended, + BS MS: no deformity or atrophy Skin: warm and dry, no rash Neuro:  Strength  and sensation are intact Psych: normal   EKG:  EKG is ordered today. Normal sinus rhythm at 63. She has been frequent premature atrial contractions consistent with bigeminy.    Recent Labs: No results found for requested labs within last 365 days.    Lipid Panel    Component Value Date/Time   CHOL 141 03/23/2014 0742   TRIG 71.0 03/23/2014 0742   HDL 66.30 03/23/2014 0742   CHOLHDL 2 03/23/2014 0742   VLDL 14.2 03/23/2014 0742   LDLCALC 61 03/23/2014 0742      Wt Readings from Last 3 Encounters:  05/29/15 160 lb (72.576 kg)  11/28/14 158 lb (71.668 kg)  05/31/14 156 lb 6.4 oz (70.943 kg)      Other studies Reviewed: Additional studies/ records that were reviewed today include: . Review of the above records demonstrates:    ASSESSMENT AND PLAN:  1. Dyspnea 2. Aortic stenosis - mild , for cardiac catheterization in August, 2014 revealed a peak to peak aortic valve gradient of 7 mmHg. In the past her Aortic stenosis has been graded as mild using echo.  She remains asymptomatic.   3. Mitral regurgitation  4. CAD - s/p Inf. STEMI, s/p BMS stenting of her mid RCA. 4.0 stent dilated to 5.0 mm. ( Aug. 8, 2014).  No angina  We will continue Plavix. She also has been taking omeprazole. We will have her discontinue the omeprazole and try Pepcid.  5. Hypertension -  BP is well controlled Typically.  BP at home  Is typically well controlled  6. Bigeminy :  Stable   Current medicines are reviewed at length with the patient today.  The patient does not have concerns regarding medicines.  The following changes have been made:  no change  Labs/ tests ordered today include:  No orders of the defined types were placed in this encounter.     Disposition:   FU with me in 6 months .      Nahser, Deloris Ping, MD  05/29/2015 9:53 AM    [facility] [address] [place], [place], [place]  [address] Phone: [phone]; Fax: [phone]   [facility]   [address] [place], [place]  [address] [phone]    Fax [phone]

## 2015-06-04 ENCOUNTER — Observation Stay (HOSPITAL_COMMUNITY)
Admission: EM | Admit: 2015-06-04 | Discharge: 2015-06-05 | Disposition: A | Discharge status: Home / Self Care | Payer: Medicare Other | Attending: Internal Medicine | Admitting: Internal Medicine

## 2015-06-04 ENCOUNTER — Encounter (HOSPITAL_COMMUNITY): Payer: Self-pay | Admitting: Emergency Medicine

## 2015-06-04 ENCOUNTER — Emergency Department (HOSPITAL_COMMUNITY): Payer: Medicare Other

## 2015-06-04 DIAGNOSIS — K449 Diaphragmatic hernia without obstruction or gangrene: Secondary | ICD-10-CM | POA: Diagnosis not present

## 2015-06-04 DIAGNOSIS — I5032 Chronic diastolic (congestive) heart failure: Secondary | ICD-10-CM | POA: Diagnosis present

## 2015-06-04 DIAGNOSIS — K5090 Crohn's disease, unspecified, without complications: Secondary | ICD-10-CM | POA: Insufficient documentation

## 2015-06-04 DIAGNOSIS — Z7902 Long term (current) use of antithrombotics/antiplatelets: Secondary | ICD-10-CM | POA: Diagnosis not present

## 2015-06-04 DIAGNOSIS — R079 Chest pain, unspecified: Secondary | ICD-10-CM | POA: Diagnosis present

## 2015-06-04 DIAGNOSIS — I110 Hypertensive heart disease with heart failure: Secondary | ICD-10-CM | POA: Insufficient documentation

## 2015-06-04 DIAGNOSIS — Z79899 Other long term (current) drug therapy: Secondary | ICD-10-CM | POA: Insufficient documentation

## 2015-06-04 DIAGNOSIS — I2510 Atherosclerotic heart disease of native coronary artery without angina pectoris: Secondary | ICD-10-CM | POA: Diagnosis present

## 2015-06-04 DIAGNOSIS — I252 Old myocardial infarction: Secondary | ICD-10-CM | POA: Diagnosis not present

## 2015-06-04 DIAGNOSIS — Z7982 Long term (current) use of aspirin: Secondary | ICD-10-CM | POA: Insufficient documentation

## 2015-06-04 DIAGNOSIS — I160 Hypertensive urgency: Secondary | ICD-10-CM | POA: Diagnosis not present

## 2015-06-04 DIAGNOSIS — Z955 Presence of coronary angioplasty implant and graft: Secondary | ICD-10-CM | POA: Insufficient documentation

## 2015-06-04 HISTORY — DX: Acute myocardial infarction, unspecified: I21.9

## 2015-06-04 LAB — CBC
HCT: 39.4 % (ref 36.0–46.0)
Hemoglobin: 12.6 g/dL (ref 12.0–15.0)
MCH: 28.3 pg (ref 26.0–34.0)
MCHC: 32 g/dL (ref 30.0–36.0)
MCV: 88.3 fL (ref 78.0–100.0)
PLATELETS: 193 10*3/uL (ref 150–400)
RBC: 4.46 MIL/uL (ref 3.87–5.11)
RDW: 13 % (ref 11.5–15.5)
WBC: 7.5 10*3/uL (ref 4.0–10.5)

## 2015-06-04 LAB — BASIC METABOLIC PANEL
Anion gap: 8 (ref 5–15)
BUN: 18 mg/dL (ref 6–20)
CHLORIDE: 111 mmol/L (ref 101–111)
CO2: 25 mmol/L (ref 22–32)
CREATININE: 1 mg/dL (ref 0.44–1.00)
Calcium: 9.4 mg/dL (ref 8.9–10.3)
GFR calc Af Amer: 59 mL/min — ABNORMAL LOW (ref >60)
GFR calc non Af Amer: 51 mL/min — ABNORMAL LOW (ref >60)
Glucose, Bld: 118 mg/dL — ABNORMAL HIGH (ref 65–99)
Potassium: 4.1 mmol/L (ref 3.5–5.1)
SODIUM: 144 mmol/L (ref 135–145)

## 2015-06-04 LAB — I-STAT TROPONIN, ED: Troponin i, poc: 0.02 ng/mL (ref 0.00–0.08)

## 2015-06-04 MED ORDER — ACETAMINOPHEN 325 MG PO TABS: 650.0000 mg | ORAL_TABLET | ORAL | Status: DC | PRN

## 2015-06-04 MED ORDER — ONDANSETRON HCL 4 MG/2ML IJ SOLN
4.0000 mg | Freq: Four times a day (QID) | INTRAMUSCULAR | Status: DC | PRN
Start: 2015-06-04 — End: 2015-06-05

## 2015-06-04 MED ORDER — PANTOPRAZOLE SODIUM 20 MG PO TBEC
20.0000 mg | DELAYED_RELEASE_TABLET | Freq: Every day | ORAL | Status: DC
  Administered 2015-06-04 – 2015-06-05 (×2): 20 mg via ORAL
  Filled 2015-06-04 (×2): qty 1

## 2015-06-04 MED ORDER — ASPIRIN EC 81 MG PO TBEC
81.0000 mg | DELAYED_RELEASE_TABLET | Freq: Every day | ORAL | Status: DC
  Administered 2015-06-04 – 2015-06-05 (×2): 81 mg via ORAL
  Filled 2015-06-04 (×2): qty 1

## 2015-06-04 MED ORDER — IRBESARTAN 150 MG PO TABS
150.0000 mg | ORAL_TABLET | Freq: Every day | ORAL | Status: DC
  Administered 2015-06-05: 150 mg via ORAL
  Filled 2015-06-04: qty 1

## 2015-06-04 MED ORDER — HYDRALAZINE HCL 20 MG/ML IJ SOLN: 10.0000 mg | INTRAMUSCULAR | Status: DC | PRN

## 2015-06-04 MED ORDER — CLOPIDOGREL BISULFATE 75 MG PO TABS
75.0000 mg | ORAL_TABLET | Freq: Every day | ORAL | Status: DC
  Administered 2015-06-04 – 2015-06-05 (×2): 75 mg via ORAL
  Filled 2015-06-04 (×2): qty 1

## 2015-06-04 MED ORDER — AMLODIPINE BESYLATE-VALSARTAN 5-160 MG PO TABS: 1.0000 | ORAL_TABLET | Freq: Every day | ORAL | Status: DC

## 2015-06-04 MED ORDER — METOPROLOL TARTRATE 25 MG PO TABS
25.0000 mg | ORAL_TABLET | Freq: Two times a day (BID) | ORAL | Status: DC
  Administered 2015-06-04 – 2015-06-05 (×2): 25 mg via ORAL
  Filled 2015-06-04 (×2): qty 1

## 2015-06-04 MED ORDER — HEPARIN SODIUM (PORCINE) 5000 UNIT/ML IJ SOLN
5000.0000 [IU] | Freq: Three times a day (TID) | INTRAMUSCULAR | Status: DC
  Administered 2015-06-04 – 2015-06-05 (×3): 5000 [IU] via SUBCUTANEOUS
  Filled 2015-06-04 (×3): qty 1

## 2015-06-04 MED ORDER — PRAVASTATIN SODIUM 40 MG PO TABS
40.0000 mg | ORAL_TABLET | Freq: Every day | ORAL | Status: DC
  Administered 2015-06-05: 40 mg via ORAL
  Filled 2015-06-04: qty 1

## 2015-06-04 MED ORDER — AMLODIPINE BESYLATE 5 MG PO TABS
5.0000 mg | ORAL_TABLET | Freq: Every day | ORAL | Status: DC
  Administered 2015-06-05: 5 mg via ORAL
  Filled 2015-06-04: qty 1

## 2015-06-04 NOTE — ED Notes (Signed)
Per [facility]  Sudden onset of substernal chest pain. No radiation, has been constant. Reproducible upon palpation, deep inspiration, as well as movement. Clear lung sounds. Neg stroke screen.  81 mg ASA daily. EMS gave the rest.  1 NTG prior to going to firestation, states it did not burn like it was supposed to. 3 NTG by EMS effective on BP. 02 given given for comfort, did not help SOB.   Heart attack in 2014 and states she had 1 stent placed. This feels like her previous heart attack.

## 2015-06-04 NOTE — ED Provider Notes (Signed)
CSN: [id number]     Arrival date & time 06/04/15  2013 History   First MD Initiated Contact with Patient 06/04/15 2023     Chief Complaint  Patient presents with  . Chest Pain     (Consider location/radiation/quality/duration/timing/severity/associated sxs/prior Treatment) HPI Comments: 81 year old female with history of Crohn's, CAD, heart attack with stent placed proximal me 2 years ago, Dr. Lourena Simmonds cardiologist, hiatal hernia presents with significant chest pressure that occurred between 5 and 7:30 this evening constant. Resolved with nitroglycerin and aspirin. Patient occasionally gets chest pressure but nothing this significant. No diaphoresis or current exertional symptoms. No radiation. Patient currently feels well.  Patient is a 81 y.o. female presenting with chest pain. The history is provided by the patient.  Chest Pain Associated symptoms: no abdominal pain, no back pain, no fever, no headache, no shortness of breath and not vomiting     Past Medical History  Diagnosis Date  . Hiatal hernia   . Crohn's disease (HCC)   . Hypertension   . Heart attack [facility])    Past Surgical History  Procedure Laterality Date  . Hernia repair    . Appendectomy     Family History  Problem Relation Age of Onset  . Heart disease Mother   . Cancer Father    Social History  Substance Use Topics  . Smoking status: Never Smoker   . Smokeless tobacco: None  . Alcohol Use: No   OB History    No data available     Review of Systems  Constitutional: Negative for fever and chills.  HENT: Negative for congestion.   Eyes: Negative for visual disturbance.  Respiratory: Negative for shortness of breath.   Cardiovascular: Positive for chest pain.  Gastrointestinal: Negative for vomiting and abdominal pain.  Genitourinary: Negative for dysuria and flank pain.  Musculoskeletal: Negative for back pain, neck pain and neck stiffness.  Skin: Negative for rash.  Neurological: Negative for  light-headedness and headaches.      Allergies  Lipitor  Home Medications   Prior to Admission medications   Medication Sig Start Date End Date Taking? Authorizing Provider  acetaminophen (TYLENOL) 325 MG tablet Take 325 mg by mouth every 6 (six) hours as needed.    Historical Provider, MD  amLODipine-valsartan (EXFORGE) 5-160 MG tablet Take 1 tablet by mouth daily. 05/29/15   Vesta Mixer, MD  aspirin 81 MG tablet Take 81 mg by mouth daily.    Historical Provider, MD  Calcium Carbonate-Vitamin D (CALCIUM + D PO) Take 600 mg by mouth daily.    Historical Provider, MD  clopidogrel (PLAVIX) 75 MG tablet Take 1 tablet (75 mg total) by mouth daily. 05/29/15   Vesta Mixer, MD  metoprolol tartrate (LOPRESSOR) 25 MG tablet Take 1 tablet (25 mg total) by mouth 2 (two) times daily. 05/29/15   Vesta Mixer, MD  nitroGLYCERIN (NITROSTAT) 0.4 MG SL tablet Place 1 tablet (0.4 mg total) under the tongue every 5 (five) minutes as needed (Do not exceed 3 pills at one time). 06/08/14   Vesta Mixer, MD  pravastatin (PRAVACHOL) 40 MG tablet TAKE 1 TABLET (40 MG TOTAL) BY MOUTH EVERY EVENING. 12/13/14   Vesta Mixer, MD   BP 176/91 mmHg  Pulse 73  Temp(Src) 98.4 F (36.9 C) (Oral)  Resp 20  Ht  (1.626 m)  Wt 160 lb (72.576 kg)  BMI 27.45 kg/m2  SpO2 97% Physical Exam  Constitutional: She is oriented to person, place, and  time. She appears well-developed and well-nourished.  HENT:  Head: Normocephalic and atraumatic.  Eyes: Conjunctivae are normal. Right eye exhibits no discharge. Left eye exhibits no discharge.  Neck: Normal range of motion. Neck supple. No tracheal deviation present.  Cardiovascular: Normal rate, regular rhythm and intact distal pulses.   Pulmonary/Chest: Effort normal and breath sounds normal.  Abdominal: Soft. She exhibits no distension. There is no tenderness. There is no guarding.  Musculoskeletal: She exhibits no edema.  Neurological: She is alert and  oriented to person, place, and time.  Skin: Skin is warm. No rash noted.  Psychiatric: She has a normal mood and affect.  Nursing note and vitals reviewed.   ED Course  Procedures (including critical care time) Labs Review Labs Reviewed  BASIC METABOLIC PANEL - Abnormal; Notable for the following:    Glucose, Bld 118 (*)    GFR calc non Af Amer 51 (*)    GFR calc Af Amer 59 (*)    All other components within normal limits  CBC  TROPONIN I  TROPONIN I  TROPONIN I  I-STAT TROPOININ, ED    Imaging Review Dg Chest 2 View  06/04/2015  CLINICAL DATA:  Chest pain; onset around 4:30pm. Hx heart attack, HTN, and cardiac stent placement. EXAM: CHEST  2 VIEW COMPARISON:  05/10/2010 FINDINGS: Hyperinflation. Accentuation of expected thoracic kyphosis. Mild osteopenia. Midline trachea. Mild cardiomegaly. A large hiatal hernia, with the majority of the stomach positioned in the left lower chest. No pleural effusion or pneumothorax. No congestive failure. Left greater than right bibasilar volume loss with atelectasis or scar. Similar. Mild biapical pleural thickening. IMPRESSION: No acute cardiopulmonary disease. Large hiatal hernia with adjacent left base volume loss and scar or atelectasis. Cardiomegaly without congestive failure. Hyperinflation. Electronically Signed   By: Jeronimo Greaves M.D.   On: 06/04/2015 20:54   I have personally reviewed and evaluated these images and lab results as part of my medical decision-making.   EKG Interpretation   Date/Time:  Sunday June 04 2015 20:19:32 EST Ventricular Rate:  79 PR Interval:  134 QRS Duration: 99 QT Interval:  404 QTC Calculation: 463 R Axis:   69 Text Interpretation:  Sinus rhythm Paired ventricular premature complexes  Left ventricular hypertrophy Confirmed by Jodi Mourning  MD, Caralina Nop ([id number]) on  06/04/2015 8:36:39 PM      MDM   Final diagnoses:  Acute chest pain   Patient was CAD history and concerning chest pressure like someone  sitting on her chest presentation, EKG no acute ischemic findings, troponin initially negative. Plan for telemetry observation tried hospital .  ASA PTA, no pressure or pain during exam.   The patients results and plan were reviewed and discussed.   Any x-rays performed were independently reviewed by myself.   Differential diagnosis were considered with the presenting HPI.  Medications - No data to display  Filed Vitals:   06/04/15 2021 06/04/15 2022 06/04/15 2023 06/04/15 2024  BP: 176/91     Pulse: 80 75 80 73  Temp: 98.4 F (36.9 C)     TempSrc: Oral     Resp: Height:      Weight:      SpO2: 97% 97% 98% 97%    Final diagnoses:  Acute chest pain    Admission/ observation were discussed with the admitting physician, patient and/or family and they are comfortable with the plan.     Blane Ohara, MD 06/04/15 2206

## 2015-06-04 NOTE — H&P (Addendum)
[facility] History and Physical  Kerri Sosa ZOX:[id number] DOB: 06-15-1931 DOA: 06/04/2015  Referring physician: EDP PCP: Delorse Lek, MD   Chief Complaint: Chest pain   HPI: Kerri Sosa is a 81 y.o. female with h/o STEMI in 2014, HTN, patient presents to the ED with c/o chest pressure that occurred between 5 pm and 7:30 pm this evening.  Resolved with ASA and NTG.  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  BP was reportedly 180 at home when she took it at the start of CP.  BP 201 right now in ED as the NTG wears off.  Pain felt "like someone was sitting on my chest", similar to prior MI in 2014.  Review of Systems: Systems reviewed.  As above, otherwise negative  Past Medical History  Diagnosis Date  . Hiatal hernia   . Crohn's disease (HCC)   . Hypertension   . Heart attack [facility])    Past Surgical History  Procedure Laterality Date  . Hernia repair    . Appendectomy     Social History:  reports that she has never smoked. She does not have any smokeless tobacco history on file. She reports that she does not drink alcohol. Her drug history is not on file.  Allergies  Allergen Reactions  . Lipitor [Atorvastatin] Other (See Comments)    ELEVATES LIVER ENZYMES    Family History  Problem Relation Age of Onset  . Heart disease Mother   . Cancer Father      Prior to Admission medications   Medication Sig Start Date End Date Taking? Authorizing Provider  amLODipine-valsartan (EXFORGE) 5-160 MG tablet Take 1 tablet by mouth daily. 05/29/15  Yes Vesta Mixer, MD  aspirin EC 81 MG tablet Take 81 mg by mouth daily.   Yes Historical Provider, MD  clopidogrel (PLAVIX) 75 MG tablet Take 1 tablet (75 mg total) by mouth daily. 05/29/15  Yes Vesta Mixer, MD  Lansoprazole (PREVACID PO) Take 1 capsule by mouth at bedtime.   Yes Historical Provider, MD  metoprolol tartrate (LOPRESSOR) 25 MG tablet Take 1  tablet (25 mg total) by mouth 2 (two) times daily. 05/29/15  Yes Vesta Mixer, MD  naproxen sodium (ALEVE) 220 MG tablet Take 220 mg by mouth daily.   Yes Historical Provider, MD  nitroGLYCERIN (NITROSTAT) 0.4 MG SL tablet Place 1 tablet (0.4 mg total) under the tongue every 5 (five) minutes as needed (Do not exceed 3 pills at one time). Patient taking differently: Place 0.4 mg under the tongue every 5 (five) minutes as needed for chest pain (Do not exceed 3 pills at one time).  06/08/14  Yes Vesta Mixer, MD  pravastatin (PRAVACHOL) 40 MG tablet TAKE 1 TABLET (40 MG TOTAL) BY MOUTH EVERY EVENING. Patient taking differently: TAKE 1 TABLET (40 MG TOTAL) BY MOUTH DAILY AT BEDTIME 12/13/14  Yes Vesta Mixer, MD   Physical Exam: Filed Vitals:   06/04/15 2045 06/04/15 2100  BP: 161/82 176/81  Pulse:  70  Temp:    Resp:  18    BP 176/81 mmHg  Pulse 70  Temp(Src) 98.4 F (36.9 C) (Oral)  Resp 18  Ht 5\' 4"  (1.626 m)  Wt 72.576 kg (160 lb)  BMI 27.45 kg/m2  SpO2 96%  General Appearance:    Alert, oriented, no distress, appears stated age  Head:    Normocephalic, atraumatic  Eyes:  PERRL, EOMI, sclera non-icteric        Nose:   Nares without drainage or epistaxis. Mucosa, turbinates normal  Throat:   Moist mucous membranes. Oropharynx without erythema or exudate.  Neck:   Supple. No carotid bruits.  No thyromegaly.  No lymphadenopathy.   Back:     No CVA tenderness, no spinal tenderness  Lungs:     Clear to auscultation bilaterally, without wheezes, rhonchi or rales  Chest wall:    No tenderness to palpitation  Heart:    Regular rate and rhythm without murmurs, gallops, rubs  Abdomen:     Soft, non-tender, nondistended, normal bowel sounds, no organomegaly  Genitalia:    deferred  Rectal:    deferred  Extremities:   No clubbing, cyanosis or edema.  Pulses:   2+ and symmetric all extremities  Skin:   Skin color, texture, turgor normal, no rashes or lesions  Lymph nodes:    Cervical, supraclavicular, and axillary nodes normal  Neurologic:   CNII-XII intact. Normal strength, sensation and reflexes      throughout    Labs on Admission:  Basic Metabolic Panel:  Recent Labs Lab 05/29/15 1050 06/04/15 2029  NA 143 144  K 4.6 4.1  CL 108 111  CO2 25 25  GLUCOSE 84 118*  BUN 14 18  CREATININE 0.87 1.00  CALCIUM 9.3 9.4   Liver Function Tests:  Recent Labs Lab 05/29/15 1050  AST 21  ALT 12  ALKPHOS 87  BILITOT 0.4  PROT 6.7  ALBUMIN 3.6   No results for input(s): LIPASE, AMYLASE in the last 168 hours. No results for input(s): AMMONIA in the last 168 hours. CBC:  Recent Labs Lab 06/04/15 2029  WBC 7.5  HGB 12.6  HCT 39.4  MCV 88.3  PLT 193   Cardiac Enzymes: No results for input(s): CKTOTAL, CKMB, CKMBINDEX, TROPONINI in the last 168 hours.  BNP (last 3 results) No results for input(s): PROBNP in the last 8760 hours. CBG: No results for input(s): GLUCAP in the last 168 hours.  Radiological Exams on Admission: Dg Chest 2 View  06/04/2015  CLINICAL DATA:  Chest pain; onset around 4:30pm. Hx heart attack, HTN, and cardiac stent placement. EXAM: CHEST  2 VIEW COMPARISON:  05/10/2010 FINDINGS: Hyperinflation. Accentuation of expected thoracic kyphosis. Mild osteopenia. Midline trachea. Mild cardiomegaly. A large hiatal hernia, with the majority of the stomach positioned in the left lower chest. No pleural effusion or pneumothorax. No congestive failure. Left greater than right bibasilar volume loss with atelectasis or scar. Similar. Mild biapical pleural thickening. IMPRESSION: No acute cardiopulmonary disease. Large hiatal hernia with adjacent left base volume loss and scar or atelectasis. Cardiomegaly without congestive failure. Hyperinflation. Electronically Signed   By: Jeronimo Greaves M.D.   On: 06/04/2015 20:54    EKG: Independently reviewed.  Assessment/Plan Principal Problem:   Hypertensive urgency Active Problems:   CAD  (coronary artery disease)   Acute chest pain   1. HTN urgency - 1. Goal BP < 180 2. Continue home BP meds 3. Due for metoprolol now 4. Add PRN IV hydralazine 2. Acute chest pain - 1. Chest pain obs pathway 2. Serial trops 3. Tele monitor 4. NPO after midnight 5. Call cards in AM to see if they want to get stress test, hasnt had one since STEMI in 2014 per patient 3. CAD - 1. Continue ASA/plavix, see above    Code Status: Full  Family Communication: Family at bedside Disposition Plan: Admit to obs  Time spent: 70 min  Raad Clayson M. [facility] Pager [phone]  If 7AM-7PM, please contact the day team taking care of the patient [email or web address] Password [facility] 06/04/2015, 9:46 PM

## 2015-06-04 NOTE — Progress Notes (Signed)
Pt arrived from the ED by stretcher. Alert and oriented and no current complaints of chest pain. Pt attached to heart monitor.  Resting in bed with call bell at bedside.  Will continue to monitor pt. Harriet Masson, RN

## 2015-06-05 ENCOUNTER — Ambulatory Visit (HOSPITAL_BASED_OUTPATIENT_CLINIC_OR_DEPARTMENT_OTHER): Payer: Medicare Other

## 2015-06-05 DIAGNOSIS — I5032 Chronic diastolic (congestive) heart failure: Secondary | ICD-10-CM

## 2015-06-05 DIAGNOSIS — I160 Hypertensive urgency: Secondary | ICD-10-CM

## 2015-06-05 DIAGNOSIS — R079 Chest pain, unspecified: Secondary | ICD-10-CM

## 2015-06-05 LAB — TROPONIN I
TROPONIN I: 0.03 ng/mL (ref <0.031)
TROPONIN I: 0.03 ng/mL (ref <0.031)
TROPONIN I: 0.03 ng/mL (ref <0.031)

## 2015-06-05 NOTE — Progress Notes (Signed)
Echocardiogram 2D Echocardiogram has been performed.  Kerri Sosa 06/05/2015, 2:26 PM

## 2015-06-05 NOTE — Discharge Summary (Signed)
Discharge Summary  Kerri Sosa OIN:[id number] DOB: [birth year]  PCP: Delorse Lek, MD  Admit date: 06/04/2015 Discharge date: 06/05/2015  Time spent: 25 minutes   Recommendations for Outpatient Follow-up:  1. Patient will follow-up with [facility] heart care, office will call patient to schedule in the next few weeks     Discharge Diagnoses:  Active Hospital Problems   Diagnosis Date Noted  . Hypertensive urgency 06/04/2015  . Acute chest pain 06/04/2015  . CAD (coronary artery disease) 04/07/2013    Resolved Hospital Problems   Diagnosis Date Noted Date Resolved  No resolved problems to display.    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 Patient also planning of some chest pressure and discomfort. Patient sent over to the emergency room where cardiac enzymes and EKG were unremarkable. Hospitals were called for further evaluation. Initial IV medications given for blood pressure.  Hospital Course:  Principal Problem:   Hypertensive urgency: Unclear etiology. Patient is quite compliant with her medications. She says her blood pressure usually 1 stable but then every few weeks she can feel it get very high, although it has never been that high (systolic greater than 200) currently stable and blood pressure has been 120.    Active Problems:   CAD (coronary artery disease)   Acute chest pain: Has been chest pain free since admission. Enzymes 3 negative. Discussed with cardiology and they will see as outpatient Chronic diastolic heart failure:  Noted on echocardiogram. Already on ARB plus beta blocker. Euvolemic.  Procedures:   Echocardiogram done on 11/21: Grade 1 diastolic dysfunction. Trivial mitral regurg and mild aortic stenosis.  Consultations:   None   Discharge Exam: BP 149/72 mmHg  Pulse 53  Temp(Src) 98 F (36.7 C) (Oral)  Resp 18  Ht 5\' 4"  (1.626 m)  Wt 71.9 kg (158 lb 8.2 oz)  BMI 27.19 kg/m2  SpO2 96%  General: Alert and oriented 3, no acute distress Cardiovascular: Regular rate and rhythm, S1-S2 Respiratory: Clear to auscultation bilaterally    Discharge Instructions You were cared for by a hospitalist during your hospital stay. If you have any questions about your discharge medications or the care you received while you were in the hospital after you are discharged, you can call the unit and asked to speak with the hospitalist on call if the hospitalist that took care of you is not available. Once you are discharged, your primary care physician will handle any further medical issues. Please note that NO REFILLS for any discharge medications will be authorized once you are discharged, as it is imperative that you return to your primary care physician (or establish a relationship with a primary care physician if you do not have one) for your aftercare needs so that they can reassess your need for medications and monitor your lab values.     Medication List    TAKE these medications        ALEVE 220 MG tablet  Generic drug:  naproxen sodium  Take 220 mg by  mouth daily.     amLODipine-valsartan 5-160 MG tablet  Commonly known as:  EXFORGE  Take 1 tablet by mouth daily.     aspirin EC 81 MG tablet  Take 81 mg by mouth daily.     clopidogrel 75 MG tablet  Commonly known as:  PLAVIX  Take 1 tablet (75 mg total) by mouth daily.     metoprolol tartrate 25 MG tablet  Commonly known as:  LOPRESSOR  Take 1 tablet (25 mg total) by mouth 2 (two) times daily.     nitroGLYCERIN 0.4 MG SL tablet    Commonly known as:  NITROSTAT  Place 1 tablet (0.4 mg total) under the tongue every 5 (five) minutes as needed (Do not exceed 3 pills at one time).     pravastatin 40 MG tablet  Commonly known as:  PRAVACHOL  TAKE 1 TABLET (40 MG TOTAL) BY MOUTH EVERY EVENING.     PREVACID PO  Take 1 capsule by mouth at bedtime.       Allergies  Allergen Reactions  . Lipitor [Atorvastatin] Other (See Comments)    ELEVATES LIVER ENZYMES      The results of significant diagnostics from this hospitalization (including imaging, microbiology, ancillary and laboratory) are listed below for reference.    Significant Diagnostic Studies: Dg Chest 2 View  06/04/2015  CLINICAL DATA:  Chest pain; onset around 4:30pm. Hx heart attack, HTN, and cardiac stent placement. EXAM: CHEST  2 VIEW COMPARISON:  05/10/2010 FINDINGS: Hyperinflation. Accentuation of expected thoracic kyphosis. Mild osteopenia. Midline trachea. Mild cardiomegaly. A large hiatal hernia, with the majority of the stomach positioned in the left lower chest. No pleural effusion or pneumothorax. No congestive failure. Left greater than right bibasilar volume loss with atelectasis or scar. Similar. Mild biapical pleural thickening. IMPRESSION: No acute cardiopulmonary disease. Large hiatal hernia with adjacent left base volume loss and scar or atelectasis. Cardiomegaly without congestive failure. Hyperinflation. Electronically Signed   By: Jeronimo Greaves M.D.   On: 06/04/2015 20:54    Microbiology: No results found for this or any previous visit (from the past 240 hour(s)).   Labs: Basic Metabolic Panel:  Recent Labs Lab 06/04/15 2029  NA 144  K 4.1  CL 111  CO2 25  GLUCOSE 118*  BUN 18  CREATININE 1.00  CALCIUM 9.4   Liver Function Tests: No results for input(s): AST, ALT, ALKPHOS, BILITOT, PROT, ALBUMIN in the last 168 hours. No results for input(s): LIPASE, AMYLASE in the last 168 hours. No results for input(s): AMMONIA in the last  168 hours. CBC:  Recent Labs Lab 06/04/15 2029  WBC 7.5  HGB 12.6  HCT 39.4  MCV 88.3  PLT 193   Cardiac Enzymes:  Recent Labs Lab 06/04/15 2301 06/05/15 0025 06/05/15 0414  TROPONINI 0.03 0.03 0.03   BNP: BNP (last 3 results) No results for input(s): BNP in the last 8760 hours.  ProBNP (last 3 results) No results for input(s): PROBNP in the last 8760 hours.  CBG: No results for input(s): GLUCAP in the last 168 hours.     Signed:  Hollice Espy  [facility] 06/05/2015, 2:41 PM

## 2015-06-06 DIAGNOSIS — I5032 Chronic diastolic (congestive) heart failure: Secondary | ICD-10-CM | POA: Diagnosis present

## 2015-09-13 ENCOUNTER — Other Ambulatory Visit: Payer: Self-pay

## 2015-09-13 MED ORDER — NITROGLYCERIN 0.4 MG SL SUBL: 0.4000 mg | SUBLINGUAL_TABLET | SUBLINGUAL | Status: DC | PRN

## 2015-09-15 ENCOUNTER — Other Ambulatory Visit: Payer: Self-pay | Admitting: *Deleted

## 2015-09-15 MED ORDER — PRAVASTATIN SODIUM 40 MG PO TABS: 40.0000 mg | ORAL_TABLET | Freq: Every day | ORAL | Status: DC

## 2015-11-29 ENCOUNTER — Other Ambulatory Visit (INDEPENDENT_AMBULATORY_CARE_PROVIDER_SITE_OTHER): Payer: Medicare Other | Admitting: *Deleted

## 2015-11-29 ENCOUNTER — Encounter: Payer: Self-pay | Admitting: Cardiovascular Disease

## 2015-11-29 ENCOUNTER — Ambulatory Visit (INDEPENDENT_AMBULATORY_CARE_PROVIDER_SITE_OTHER): Payer: Medicare Other | Admitting: Cardiovascular Disease

## 2015-11-29 VITALS — BP 160/80 | HR 56 | Ht 64.0 in | Wt 156.0 lb

## 2015-11-29 DIAGNOSIS — I2510 Atherosclerotic heart disease of native coronary artery without angina pectoris: Secondary | ICD-10-CM

## 2015-11-29 DIAGNOSIS — I2584 Coronary atherosclerosis due to calcified coronary lesion: Secondary | ICD-10-CM

## 2015-11-29 DIAGNOSIS — I350 Nonrheumatic aortic (valve) stenosis: Secondary | ICD-10-CM | POA: Diagnosis not present

## 2015-11-29 DIAGNOSIS — I5032 Chronic diastolic (congestive) heart failure: Secondary | ICD-10-CM

## 2015-11-29 DIAGNOSIS — I10 Essential (primary) hypertension: Secondary | ICD-10-CM

## 2015-11-29 DIAGNOSIS — E785 Hyperlipidemia, unspecified: Secondary | ICD-10-CM

## 2015-11-29 LAB — LIPID PANEL
Cholesterol: 151 mg/dL (ref 125–200)
HDL: 83 mg/dL (ref >=46)
LDL CALC: 57 mg/dL (ref <130)
Total CHOL/HDL Ratio: 1.8 Ratio (ref <=5.0)
Triglycerides: 56 mg/dL (ref <150)
VLDL: 11 mg/dL (ref <30)

## 2015-11-29 LAB — COMPREHENSIVE METABOLIC PANEL
ALBUMIN: 3.5 g/dL — AB (ref 3.6–5.1)
ALT: 7 U/L (ref 6–29)
AST: 14 U/L (ref 10–35)
Alkaline Phosphatase: 86 U/L (ref 33–130)
BILIRUBIN TOTAL: 0.5 mg/dL (ref 0.2–1.2)
BUN: 16 mg/dL (ref 7–25)
CHLORIDE: 107 mmol/L (ref 98–110)
CO2: 27 mmol/L (ref 20–31)
CREATININE: 0.9 mg/dL — AB (ref 0.60–0.88)
Calcium: 9.1 mg/dL (ref 8.6–10.4)
GLUCOSE: 95 mg/dL (ref 65–99)
Potassium: 4.4 mmol/L (ref 3.5–5.3)
SODIUM: 141 mmol/L (ref 135–146)
Total Protein: 6.7 g/dL (ref 6.1–8.1)

## 2015-11-29 MED ORDER — POTASSIUM CHLORIDE CRYS ER 20 MEQ PO TBCR: 20.0000 meq | EXTENDED_RELEASE_TABLET | Freq: Every day | ORAL | Status: DC

## 2015-11-29 MED ORDER — HYDROCHLOROTHIAZIDE 25 MG PO TABS: 25.0000 mg | ORAL_TABLET | Freq: Every day | ORAL | Status: DC

## 2015-11-29 MED ORDER — CARVEDILOL 12.5 MG PO TABS: 12.5000 mg | ORAL_TABLET | Freq: Two times a day (BID) | ORAL | Status: DC

## 2015-11-29 NOTE — Patient Instructions (Addendum)
Medication Instructions:  STOP Metoprolol (Lopressor) START Carvedilol (Coreg) 12.5 mg twice daily START HCTZ (Hydrochlorothiazide) 25 mg once daily START Kdur (potassium supplement) 20 meq once daily   Labwork: Your physician recommends that you return for lab work in: 3 weeks on same day as nurse visit for basic metabolic panel   Testing/Procedures: None Ordered   Follow-Up: Your physician recommends that you return for follow up appointment on:  Friday June 9 at 11:00 for Nurse visit/BP check   Your physician wants you to follow-up in: 6 months with Dr. Elease Hashimoto.  You will receive a reminder letter in the mail two months in advance. If you don't receive a letter, please call our office to schedule the follow-up appointment.   If you need a refill on your cardiac medications before your next appointment, please call your pharmacy.   Thank you for choosing [facility]! Eligha Bridegroom, RN [phone]

## 2015-11-29 NOTE — Progress Notes (Signed)
Cardiology Office Note   Date:  11/29/2015   ID:  Kerri Sosa, Kerri Sosa 05/18/1931, MRN [id number]  PCP:  Delorse Lek, MD  Cardiologist:   Kristeen Miss, MD   Chief Complaint  Patient presents with  . Coronary Artery Disease   1. Dyspnea 2. Aortic stenosis - mild , for cardiac catheterization in August, 2014 revealed a peak to peak aortic valve gradient of 7 mmHg. In the past her Aortic stenosis has been graded as mild using echo. 3. Mitral regurgitation 4. CAD - s/p Inf. STEMI, s/p BMS stenting of her mid RCA. 4.0 stent dilated to 5.0 mm. ( Aug. 8, 2014) 5. Hypertension  History of Present Illness:  [name] is a 82 yo with hx of mild AS and MR. 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 She does not get any regular exercise.  She denies any syncopal episodes. She denies any chest pain.  Sept. 24, 2014:  Kerri Sosa was out of town and had an inferior wall ST segment elevation myocardial infarction. She was on vacation at [facility] and started having episodes of midsternal pain. She was transferred to [facility] and taken to [facility]. She had an urgent cardiac catheterization which revealed an occluded mid right coronary artery. She had a 4.0 x 20 mm bare-metal stent placed in her mid right coronary artery. He was post dilated up to 5.0 mm with a noncompliant balloon.  She is doing well at this point. No CP. She still has some dyspnea.   She's eager to get back to work. She's not had any severe complications. She measures her blood pressure on regular basis and her readings are typically in the normal range.  Dec. 4, 2014:  No cardiac symptoms. Has had a head cold.   December 14, 2013:  [name] is doing well. She is going to visit her daughter at [facility] next week. No angina pain. 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   No CP, dyspnea, or presyncope.  Nov. 17 ,2015:  Kerri Sosa  is a 82 y.o. female who I follow for her CAD ( MI while in [place], [place] Aug. 2014) and her aortic stenosis.   Kerri Sosa is doing well. Still works at her church. Goes up and down stairs without problems. Occasionally out of breath at the top of the stairs.  No Cp , no dizziness.   BP has been normal at home.  A bit elevated today.    Nov 28 2014:   Kerri Sosa is a 82 y.o. female who presents for follow up of CAD No cp BP is a bit elevated at home. BP at home is normal .     Nov. 14, 2016:  Able to do all of her normal activities. Just got back from fishing at the beach.    No CP or dyspnea.  BP is here in the office.  Was normal at home .  She watches her salt .   May 17 ,2017:  Doing well . No CP , no dyspnea.  Able to do all of her normal activities  BP is a bit high   Past Medical History  Diagnosis Date  . Hiatal hernia   . Crohn's disease (HCC)   . Hypertension   . Heart attack [facility])     Past Surgical History  Procedure Laterality Date  . Hernia repair    . Appendectomy  Current Outpatient Prescriptions  Medication Sig Dispense Refill  . amLODipine-valsartan (EXFORGE) 5-160 MG tablet Take 1 tablet by mouth daily. 90 tablet 3  . aspirin EC 81 MG tablet Take 81 mg by mouth daily.    . clopidogrel (PLAVIX) 75 MG tablet Take 1 tablet (75 mg total) by mouth daily. 90 tablet 3  . Lansoprazole (PREVACID PO) Take 1 capsule by mouth at bedtime.    . metoprolol tartrate (LOPRESSOR) 25 MG tablet Take 1 tablet (25 mg total) by mouth 2 (two) times daily. 180 tablet 3  . naproxen sodium (ALEVE) 220 MG tablet Take 220 mg by mouth daily.    . nitroGLYCERIN (NITROSTAT) 0.4 MG SL tablet Place 1 tablet (0.4 mg total) under the tongue every 5 (five) minutes as needed for chest pain (Do not exceed 3 pills at one time). 25 tablet 2  . pravastatin (PRAVACHOL) 40 MG tablet Take 1 tablet (40 mg total) by mouth daily. 90 tablet 3   No current facility-administered medications  for this visit.    Allergies:   Lipitor    Social History:  The patient  reports that she has never smoked. She does not have any smokeless tobacco history on file. She reports that she does not drink alcohol.   Family History:  The patient's family history includes Cancer in her father; Heart disease in her mother.    ROS:  Please see the history of present illness.    Review of Systems: Constitutional:  denies fever, chills, diaphoresis, appetite change and fatigue.  HEENT: denies photophobia, eye pain, redness, hearing loss, ear pain, congestion, sore throat, rhinorrhea, sneezing, neck pain, neck stiffness and tinnitus.  Respiratory: denies SOB, DOE, cough, chest tightness, and wheezing.  Cardiovascular: denies chest pain, palpitations and leg swelling.  Gastrointestinal: denies nausea, vomiting, abdominal pain, diarrhea, constipation, blood in stool.  Genitourinary: denies dysuria, urgency, frequency, hematuria, flank pain and difficulty urinating.  Musculoskeletal: denies  myalgias, back pain, joint swelling, arthralgias and gait problem.   Skin: denies pallor, rash and wound.  Neurological: denies dizziness, seizures, syncope, weakness, light-headedness, numbness and headaches.   Hematological: denies adenopathy, easy bruising, personal or family bleeding history.  Psychiatric/ Behavioral: denies suicidal ideation, mood changes, confusion, nervousness, sleep disturbance and agitation.       All other systems are reviewed and negative.    PHYSICAL EXAM: VS:  BP 160/80 mmHg  Pulse 56  Ht  (1.626 m)  Wt 156 lb (70.761 kg)  BMI 26.76 kg/m2 , BMI Body mass index is 26.76 kg/(m^2). GEN: Well nourished, well developed, in no acute distress HEENT: normal Neck: no JVD, carotid bruits, or masses Cardiac: Regularly irregular in a bigeminal pattern.    no edema  Respiratory:  clear to auscultation bilaterally, normal work of breathing GI: soft, nontender, nondistended, +  BS MS: no deformity or atrophy Skin: warm and dry, no rash Neuro:  Strength and sensation are intact Psych: normal   EKG:  EKG is ordered today. Normal sinus rhythm at 63. She has been frequent premature atrial contractions consistent with bigeminy.    Recent Labs: 05/29/2015: ALT 12 06/04/2015: BUN 18; Creatinine, Ser 1.00; Hemoglobin 12.6; Platelets 193; Potassium 4.1; Sodium 144    Lipid Panel    Component Value Date/Time   CHOL 141 05/29/2015 1050   TRIG 62 05/29/2015 1050   HDL 73 05/29/2015 1050   CHOLHDL 1.9 05/29/2015 1050   VLDL 12 05/29/2015 1050   LDLCALC 56 05/29/2015 1050  Wt Readings from Last 3 Encounters:  11/29/15 156 lb (70.761 kg)  06/04/15 158 lb 8.2 oz (71.9 kg)  05/29/15 160 lb (72.576 kg)      Other studies Reviewed: Additional studies/ records that were reviewed today include: . Review of the above records demonstrates:    ASSESSMENT AND PLAN:  1. Dyspnea 2. Aortic stenosis - mild , for cardiac catheterization in August, 2014 revealed a peak to peak aortic valve gradient of 7 mmHg. In the past her Aortic stenosis has been graded as mild using echo.  She remains asymptomatic.   3. Mitral regurgitation  4. CAD - s/p Inf. STEMI, s/p BMS stenting of her mid RCA. 4.0 stent dilated to 5.0 mm. ( Aug. 8, 2014).  No angina  We will continue Plavix. She also has been taking omeprazole. We will have her discontinue the omeprazole and try Pepcid.  5. Hypertension -   Will add HCTZ 25 a day , Kdur 20 meq a day Will also DC metoprolol and start Coreg 12.5 BID   She will see Dr. Doristine Counter in a month. We can consider increasing the Exforge to 10-320 a day if the BP remain elevated   6. Bigeminy :  Stable   Current medicines are reviewed at length with the patient today.  The patient does not have concerns regarding medicines.  The following changes have been made:  no change  Labs/ tests ordered today include:  No orders of the defined types  were placed in this encounter.     Disposition:   FU with me in 6 months .      Kristeen Miss, MD  11/29/2015 10:01 AM    [facility] [address] [place], [place], [place]  [address] Phone: [phone]; Fax: [phone]   [facility]  [address] [place], [place]  [address] [phone]    Fax [phone]

## 2015-12-22 ENCOUNTER — Ambulatory Visit (INDEPENDENT_AMBULATORY_CARE_PROVIDER_SITE_OTHER): Payer: Medicare Other | Admitting: *Deleted

## 2015-12-22 VITALS — BP 112/70 | HR 58 | Ht 64.0 in | Wt 159.0 lb

## 2015-12-22 DIAGNOSIS — I10 Essential (primary) hypertension: Secondary | ICD-10-CM

## 2015-12-22 NOTE — Patient Instructions (Signed)
No changes

## 2015-12-22 NOTE — Progress Notes (Signed)
1.) Reason for visit: BP Check  2.) Name of MD requesting visit: Dr. Elease Hashimoto   3.) ROS related to problem: Pt denies dizziness, HA, SOB or CP.  Pt states that she has slight lightheadedness after taking medication but it resolves quickly and she is able to go on with her day.  Pt brought list of BP's that she has recorded since her last visit.  All vitals on list are within normal limits.  BP today is 112/70, HR 58.    4.) Assessment and plan per MD: Reviewed with Dr. Elease Hashimoto and he made no changes at this time.  Advised pt to keep taking current meds and continue recording blood pressure and let our office know if it becomes elevated regularly again.  Pt verbalized understanding and was in agreement with this plan.

## 2016-05-29 ENCOUNTER — Ambulatory Visit (INDEPENDENT_AMBULATORY_CARE_PROVIDER_SITE_OTHER): Payer: Medicare Other | Admitting: Cardiovascular Disease

## 2016-05-29 ENCOUNTER — Encounter: Payer: Self-pay | Admitting: Cardiovascular Disease

## 2016-05-29 VITALS — BP 118/70 | HR 60 | Ht 64.0 in | Wt 160.2 lb

## 2016-05-29 DIAGNOSIS — I2510 Atherosclerotic heart disease of native coronary artery without angina pectoris: Secondary | ICD-10-CM

## 2016-05-29 DIAGNOSIS — I350 Nonrheumatic aortic (valve) stenosis: Secondary | ICD-10-CM

## 2016-05-29 DIAGNOSIS — I2584 Coronary atherosclerosis due to calcified coronary lesion: Secondary | ICD-10-CM | POA: Diagnosis not present

## 2016-05-29 NOTE — Progress Notes (Signed)
Cardiology Office Note   Date:  05/29/2016   ID:  Vora, Cacy May 05, 1931, MRN [id number]  PCP:  Lilia Argue  Cardiologist:   Kristeen Miss, MD   Chief Complaint  Patient presents with  . Coronary Artery Disease   1. Dyspnea 2. Aortic stenosis - mild , for cardiac catheterization in August, 2014 revealed a peak to peak aortic valve gradient of 7 mmHg. In the past her Aortic stenosis has been graded as mild using echo. 3. Mitral regurgitation 4. CAD - s/p Inf. STEMI, s/p BMS stenting of her mid RCA. 4.0 stent dilated to 5.0 mm. ( Aug. 8, 2014) 5. Hypertension  History of Present Illness:  Ms. Ambriz is a 82 yo with hx of mild AS and MR. 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 She does not get any regular exercise.  She denies any syncopal episodes. She denies any chest pain.  Sept. 24, 2014:  Neona was out of town and had an inferior wall ST segment elevation myocardial infarction. She was on vacation at [facility] and started having episodes of midsternal pain. She was transferred to [facility] and taken to [facility]. She had an urgent cardiac catheterization which revealed an occluded mid right coronary artery. She had a 4.0 x 20 mm bare-metal stent placed in her mid right coronary artery. He was post dilated up to 5.0 mm with a noncompliant balloon.  She is doing well at this point. No CP. She still has some dyspnea.   She's eager to get back to work. She's not had any severe complications. She measures her blood pressure on regular basis and her readings are typically in the normal range.  Dec. 4, 2014:  No cardiac symptoms. Has had a head cold.   December 14, 2013:  Aronda is doing well. She is going to visit her daughter at [facility] next week. No angina pain. 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   No CP, dyspnea, or presyncope.  Nov. 17 ,2015:  Anjel  is a 82 y.o. female who I follow for her CAD ( MI while in [place], [place] Aug. 2014) and her aortic stenosis.   Voncille is doing well. Still works at her church. Goes up and down stairs without problems. Occasionally out of breath at the top of the stairs.  No Cp , no dizziness.   BP has been normal at home.  A bit elevated today.    Nov 28 2014:   JACQUALINE MCLAGAN is a 82 y.o. female who presents for follow up of CAD No cp BP is a bit elevated at home. BP at home is normal .     Nov. 14, 2016:  Able to do all of her normal activities. Just got back from fishing at the beach.    No CP or dyspnea.  BP is here in the office.  Was normal at home .  She watches her salt .   May 17 ,2017:  Doing well . No CP , no dyspnea.  Able to do all of her normal activities  BP is a bit high   Nov. 15, 2017:  Doing well.   Is short of breath.   Has DOE  She cleans the church every week.   She hauls  wood in the winter    Past Medical History:  Diagnosis Date  . Crohn's disease (HCC)   .  Heart attack   . Hiatal hernia   . Hypertension     Past Surgical History:  Procedure Laterality Date  . APPENDECTOMY    . HERNIA REPAIR       Current Outpatient Prescriptions  Medication Sig Dispense Refill  . amLODipine-valsartan (EXFORGE) 5-160 MG tablet Take 1 tablet by mouth daily. 90 tablet 3  . aspirin 325 MG tablet Take 325 mg by mouth every other day. Take 1 tablet by mouth every other day alternating with Aspirin 81mg     . aspirin EC 81 MG tablet Take 81 mg by mouth daily.    . carvedilol (COREG) 12.5 MG tablet Take 1 tablet (12.5 mg total) by mouth 2 (two) times daily. 60 tablet 11  . clopidogrel (PLAVIX) 75 MG tablet Take 1 tablet (75 mg total) by mouth daily. 90 tablet 3  . hydrochlorothiazide (HYDRODIURIL) 25 MG tablet Take 1 tablet (25 mg total) by mouth daily. 30 tablet 11  . Lansoprazole (PREVACID PO) Take 1 capsule by mouth at bedtime.    . naproxen sodium (ALEVE) 220 MG  tablet Take 220 mg by mouth daily.    . nitroGLYCERIN (NITROSTAT) 0.4 MG SL tablet Place 1 tablet (0.4 mg total) under the tongue every 5 (five) minutes as needed for chest pain (Do not exceed 3 pills at one time). 25 tablet 2  . potassium chloride SA (K-DUR,KLOR-CON) 20 MEQ tablet Take 1 tablet (20 mEq total) by mouth daily. 30 tablet 11  . pravastatin (PRAVACHOL) 40 MG tablet Take 1 tablet (40 mg total) by mouth daily. 90 tablet 3   No current facility-administered medications for this visit.     Allergies:   Lipitor [atorvastatin]    Social History:  The patient  reports that she has never smoked. She does not have any smokeless tobacco history on file. She reports that she does not drink alcohol.   Family History:  The patient's family history includes Cancer in her father; Heart disease in her mother.    ROS:  Please see the history of present illness.    Review of Systems: Constitutional:  denies fever, chills, diaphoresis, appetite change and fatigue.  HEENT: denies photophobia, eye pain, redness, hearing loss, ear pain, congestion, sore throat, rhinorrhea, sneezing, neck pain, neck stiffness and tinnitus.  Respiratory: denies SOB, DOE, cough, chest tightness, and wheezing.  Cardiovascular: denies chest pain, palpitations and leg swelling.  Gastrointestinal: denies nausea, vomiting, abdominal pain, diarrhea, constipation, blood in stool.  Genitourinary: denies dysuria, urgency, frequency, hematuria, flank pain and difficulty urinating.  Musculoskeletal: denies  myalgias, back pain, joint swelling, arthralgias and gait problem.   Skin: denies pallor, rash and wound.  Neurological: denies dizziness, seizures, syncope, weakness, light-headedness, numbness and headaches.   Hematological: denies adenopathy, easy bruising, personal or family bleeding history.  Psychiatric/ Behavioral: denies suicidal ideation, mood changes, confusion, nervousness, sleep disturbance and agitation.        All other systems are reviewed and negative.    PHYSICAL EXAM: VS:  BP 118/70   Pulse 60   Ht 5\' 4"  (1.626 m)   Wt 160 lb 3.2 oz (72.7 kg)   BMI 27.50 kg/m  , BMI Body mass index is 27.5 kg/m. GEN: Well nourished, well developed, in no acute distress  HEENT: normal  Neck: no JVD, carotid bruits, or masses Cardiac: Reg Rhythm , soft systolic murmur     no edema  Respiratory:  clear to auscultation bilaterally, normal work of breathing GI: soft, nontender, nondistended, + BS  MS: no deformity or atrophy  Skin: warm and dry, no rash Neuro:  Strength and sensation are intact Psych: normal   EKG:  EKG is ordered today. Normal sinus rhythm at 63. She had one premature ventricular contraction..    Recent Labs: 06/04/2015: Hemoglobin 12.6; Platelets 193 11/29/2015: ALT 7; BUN 16; Creat 0.90; Potassium 4.4; Sodium 141    Lipid Panel    Component Value Date/Time   CHOL 151 11/29/2015 0842   TRIG 56 11/29/2015 0842   HDL 83 11/29/2015 0842   CHOLHDL 1.8 11/29/2015 0842   VLDL 11 11/29/2015 0842   LDLCALC 57 11/29/2015 0842      Wt Readings from Last 3 Encounters:  05/29/16 160 lb 3.2 oz (72.7 kg)  12/22/15 159 lb (72.1 kg)  11/29/15 156 lb (70.8 kg)      Other studies Reviewed: Additional studies/ records that were reviewed today include: . Review of the above records demonstrates:    ASSESSMENT AND PLAN:  1. Dyspnea- she complains of shortness of breath that she is actually very busy. 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 I've suggested that she try starting a walking program.   2. Aortic stenosis - mild , for cardiac catheterization in August, 2014 revealed a peak to peak aortic valve gradient of 7 mmHg. In the past her Aortic stenosis has been graded as mild using echo.  She remains asymptomatic.   3. Mitral regurgitation  4. CAD - s/p Inf. STEMI, s/p BMS stenting of her mid RCA. 4.0 stent dilated to 5.0 mm. ( Aug. 8,  2014).  No angina  We will continue Plavix. She also has been taking omeprazole. We will have her discontinue the omeprazole and try Pepcid.  5. Hypertension -    HCTZ 25 a day , Kdur 20 meq a day   Coreg 12.5 BID      6. Bigeminy :  Stable .  She had 1 PVC in ECG today   Current medicines are reviewed at length with the patient today.  The patient does not have concerns regarding medicines.  The following changes have been made:  no change  Labs/ tests ordered today include:  No orders of the defined types were placed in this encounter.    Disposition:   FU with me in 6 months .      Kristeen MissPhilip Nahser, MD  05/29/2016 3:38 PM    [facility] [address], [place], [place]  [address] Phone: [phone]; Fax: [phone]   [facility]  [address] [place], [place]  [address] [phone]    Fax [phone]

## 2016-05-29 NOTE — Patient Instructions (Signed)

## 2016-06-03 ENCOUNTER — Other Ambulatory Visit: Payer: Self-pay | Admitting: Cardiovascular Disease

## 2016-06-03 MED ORDER — CLOPIDOGREL BISULFATE 75 MG PO TABS: 75.0000 mg | ORAL_TABLET | Freq: Every day | ORAL | 3 refills | Status: DC

## 2016-11-29 ENCOUNTER — Other Ambulatory Visit: Payer: Self-pay | Admitting: Cardiovascular Disease

## 2016-12-03 ENCOUNTER — Other Ambulatory Visit: Payer: Self-pay | Admitting: Cardiovascular Disease

## 2016-12-31 ENCOUNTER — Other Ambulatory Visit: Payer: Self-pay | Admitting: Cardiovascular Disease

## 2017-01-09 ENCOUNTER — Other Ambulatory Visit: Payer: Medicare Other | Admitting: *Deleted

## 2017-01-09 ENCOUNTER — Other Ambulatory Visit: Payer: Self-pay | Admitting: *Deleted

## 2017-01-09 DIAGNOSIS — I2510 Atherosclerotic heart disease of native coronary artery without angina pectoris: Secondary | ICD-10-CM

## 2017-01-09 LAB — COMPREHENSIVE METABOLIC PANEL
ALBUMIN: 3.8 g/dL (ref 3.5–4.7)
ALK PHOS: 89 IU/L (ref 39–117)
ALT: 9 IU/L (ref 0–32)
AST: 16 IU/L (ref 0–40)
Albumin/Globulin Ratio: 1.3 (ref 1.2–2.2)
BUN / CREAT RATIO: 21 (ref 12–28)
BUN: 21 mg/dL (ref 8–27)
Bilirubin Total: 0.4 mg/dL (ref 0.0–1.2)
CO2: 24 mmol/L (ref 20–29)
CREATININE: 0.98 mg/dL (ref 0.57–1.00)
Calcium: 9.3 mg/dL (ref 8.7–10.3)
Chloride: 105 mmol/L (ref 96–106)
GFR calc Af Amer: 62 mL/min/{1.73_m2} (ref >59)
GFR calc non Af Amer: 54 mL/min/{1.73_m2} — ABNORMAL LOW (ref >59)
GLUCOSE: 93 mg/dL (ref 65–99)
Globulin, Total: 2.9 g/dL (ref 1.5–4.5)
Potassium: 4.3 mmol/L (ref 3.5–5.2)
Sodium: 144 mmol/L (ref 134–144)
Total Protein: 6.7 g/dL (ref 6.0–8.5)

## 2017-01-09 LAB — LIPID PANEL
Chol/HDL Ratio: 2.2 ratio (ref 0.0–4.4)
Cholesterol, Total: 155 mg/dL (ref 100–199)
HDL: 69 mg/dL (ref >39)
LDL Calculated: 72 mg/dL (ref 0–99)
Triglycerides: 71 mg/dL (ref 0–149)
VLDL CHOLESTEROL CAL: 14 mg/dL (ref 5–40)

## 2017-01-16 ENCOUNTER — Encounter: Payer: Self-pay | Admitting: Cardiovascular Disease

## 2017-01-16 ENCOUNTER — Ambulatory Visit (INDEPENDENT_AMBULATORY_CARE_PROVIDER_SITE_OTHER): Payer: Medicare Other | Admitting: Cardiovascular Disease

## 2017-01-16 VITALS — BP 148/82 | HR 50 | Ht 64.0 in | Wt 157.4 lb

## 2017-01-16 DIAGNOSIS — I2510 Atherosclerotic heart disease of native coronary artery without angina pectoris: Secondary | ICD-10-CM | POA: Diagnosis not present

## 2017-01-16 DIAGNOSIS — I10 Essential (primary) hypertension: Secondary | ICD-10-CM

## 2017-01-16 DIAGNOSIS — E785 Hyperlipidemia, unspecified: Secondary | ICD-10-CM | POA: Diagnosis not present

## 2017-01-16 DIAGNOSIS — I2583 Coronary atherosclerosis due to lipid rich plaque: Secondary | ICD-10-CM

## 2017-01-16 NOTE — Progress Notes (Signed)
Cardiology Office Note   Date:  01/16/2017   ID:  Kerri, Sosa Aug 30, 1930, MRN [id number]  PCP:  Richmond Campbell., PA-C  Cardiologist:   Kristeen Miss, MD   Chief Complaint  Patient presents with  . Coronary Artery Disease   1. Dyspnea 2. Aortic stenosis - mild , for cardiac catheterization in August, 2014 revealed a peak to peak aortic valve gradient of 7 mmHg. In the past her Aortic stenosis has been graded as mild using echo. 3. Mitral regurgitation 4. CAD - s/p Inf. STEMI, s/p BMS stenting of her mid RCA. 4.0 stent dilated to 5.0 mm. ( Aug. 8, 2014) 5. Hypertension  History of Present Illness:  [name] is a 83 yo with hx of mild AS and MR. 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 She does not get any regular exercise.  She denies any syncopal episodes. She denies any chest pain.  Sept. 24, 2014:  Kerri Sosa was out of town and had an inferior wall ST segment elevation myocardial infarction. She was on vacation at [facility] and started having episodes of midsternal pain. She was transferred to [facility] and taken to [facility]. She had an urgent cardiac catheterization which revealed an occluded mid right coronary artery. She had a 4.0 x 20 mm bare-metal stent placed in her mid right coronary artery. He was post dilated up to 5.0 mm with a noncompliant balloon.  She is doing well at this point. No CP. She still has some dyspnea.   She's eager to get back to work. She's not had any severe complications. She measures her blood pressure on regular basis and her readings are typically in the normal range.  Dec. 4, 2014:  No cardiac symptoms. Has had a head cold.   December 14, 2013:  Kerri Sosa is doing well. She is going to visit her daughter at [facility] next week. No angina pain. 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   No CP, dyspnea, or presyncope.  Nov. 17  ,2015:  Kerri Sosa is a 83 y.o. female who I follow for her CAD ( MI while in [place], [place] Aug. 2014) and her aortic stenosis.   Kerri Sosa is doing well. Still works at her church. Goes up and down stairs without problems. Occasionally out of breath at the top of the stairs.  No Cp , no dizziness.   BP has been normal at home.  A bit elevated today.    Nov 28 2014:   Kerri Sosa is a 83 y.o. female who presents for follow up of CAD No cp BP is a bit elevated at home. BP at home is normal .     Nov. 14, 2016:  Able to do all of her normal activities. Just got back from fishing at the beach.    No CP or dyspnea.  BP is here in the office.  Was normal at home .  She watches her salt .   May 17 ,2017:  Doing well . No CP , no dyspnea.  Able to do all of her normal activities  BP is a bit high   Nov. 15, 2017:  Doing well.   Is short of breath.   Has DOE  She cleans the church every week.   She hauls  wood in the winter   January 16, 2017:  [name] is seen today for follow up  of her CAD . Has very mild aortic stenosis.   Past Medical History:  Diagnosis Date  . Crohn's disease (HCC)   . Heart attack (HCC)   . Hiatal hernia   . Hypertension     Past Surgical History:  Procedure Laterality Date  . APPENDECTOMY    . HERNIA REPAIR       Current Outpatient Prescriptions  Medication Sig Dispense Refill  . amLODipine-valsartan (EXFORGE) 5-160 MG tablet Take 1 tablet by mouth daily. 90 tablet 3  . aspirin 325 MG tablet Take 325 mg by mouth every other day. Take 1 tablet by mouth every other day alternating with Aspirin 81mg     . aspirin EC 81 MG tablet Take 81 mg by mouth daily.    . carvedilol (COREG) 12.5 MG tablet TAKE ONE TABLET BY MOUTH TWICE DAILY 60 tablet 5  . clopidogrel (PLAVIX) 75 MG tablet Take 1 tablet (75 mg total) by mouth daily. 90 tablet 3  . hydrochlorothiazide (HYDRODIURIL) 25 MG tablet TAKE ONE TABLET BY MOUTH EVERY DAY 30 tablet 5  . Lansoprazole  (PREVACID PO) Take 1 capsule by mouth at bedtime.    . naproxen sodium (ALEVE) 220 MG tablet Take 220 mg by mouth daily.    . nitroGLYCERIN (NITROSTAT) 0.4 MG SL tablet Place 1 tablet (0.4 mg total) under the tongue every 5 (five) minutes as needed for chest pain (Do not exceed 3 pills at one time). 25 tablet 2  . potassium chloride SA (K-DUR,KLOR-CON) 20 MEQ tablet TAKE ONE TABLET BY MOUTH EVERY DAY 30 tablet 5  . pravastatin (PRAVACHOL) 40 MG tablet Take 1 tablet (40 mg total) by mouth daily. 90 tablet 3   No current facility-administered medications for this visit.     Allergies:   Lipitor [atorvastatin]    Social History:  The patient  reports that she has never smoked. She has never used smokeless tobacco. She reports that she does not drink alcohol.   Family History:  The patient's family history includes Cancer in her father; Heart disease in her mother.    ROS:  Please see the history of present illness.    Review of Systems: Constitutional:  denies fever, chills, diaphoresis, appetite change and fatigue.  HEENT: denies photophobia, eye pain, redness, hearing loss, ear pain, congestion, sore throat, rhinorrhea, sneezing, neck pain, neck stiffness and tinnitus.  Respiratory: denies SOB, DOE, cough, chest tightness, and wheezing.  Cardiovascular: denies chest pain, palpitations and leg swelling.  Gastrointestinal: denies nausea, vomiting, abdominal pain, diarrhea, constipation, blood in stool.  Genitourinary: denies dysuria, urgency, frequency, hematuria, flank pain and difficulty urinating.  Musculoskeletal: denies  myalgias, back pain, joint swelling, arthralgias and gait problem.   Skin: denies pallor, rash and wound.  Neurological: denies dizziness, seizures, syncope, weakness, light-headedness, numbness and headaches.   Hematological: denies adenopathy, easy bruising, personal or family bleeding history.  Psychiatric/ Behavioral: denies suicidal ideation, mood changes,  confusion, nervousness, sleep disturbance and agitation.       All other systems are reviewed and negative.    PHYSICAL EXAM: VS:  BP (!) 148/82 (BP Location: Left Arm, Patient Position: Sitting, Cuff Size: Normal)   Pulse (!) 50   Ht 5\' 4"  (1.626 m)   Wt 157 lb 6.4 oz (71.4 kg)   SpO2 96%   BMI 27.02 kg/m  , BMI Body mass index is 27.02 kg/m. GEN: Well nourished, well developed, in no acute distress  HEENT: normal  Neck: no JVD, carotid bruits, or masses Cardiac:  Reg Rhythm , soft systolic murmur     no edema  Respiratory:  clear to auscultation bilaterally, normal work of breathing GI: soft, nontender, nondistended, + BS MS: no deformity or atrophy  Skin: warm and dry, no rash Neuro:  Strength and sensation are intact Psych: normal   EKG:  EKG is ordered today. Normal sinus rhythm at 63. She had one premature ventricular contraction..    Recent Labs: 01/09/2017: ALT 9; BUN 21; Creatinine, Ser 0.98; Potassium 4.3; Sodium 144    Lipid Panel    Component Value Date/Time   CHOL 155 01/09/2017 1004   TRIG 71 01/09/2017 1004   HDL 69 01/09/2017 1004   CHOLHDL 2.2 01/09/2017 1004   CHOLHDL 1.8 11/29/2015 0842   VLDL 11 11/29/2015 0842   LDLCALC 72 01/09/2017 1004      Wt Readings from Last 3 Encounters:  01/16/17 157 lb 6.4 oz (71.4 kg)  05/29/16 160 lb 3.2 oz (72.7 kg)  12/22/15 159 lb (72.1 kg)      Other studies Reviewed: Additional studies/ records that were reviewed today include: . Review of the above records demonstrates:    ASSESSMENT AND PLAN:  1. Dyspnea- she complains of shortness of breath that she is actually very busy. 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  She remains very busy    2. Aortic stenosis  - doing well   3. Mitral regurgitation  4. CAD - s/p Inf. STEMI, s/p BMS stenting of her mid RCA. 4.0 stent dilated to 5.0 mm. ( Aug. 8, 2014).  No angina  We will continue Plavix. She also has been taking  omeprazole. We will have her discontinue the omeprazole and try Pepcid.  5. Hypertension -    HCTZ 25 a day , Kdur 20 meq a day   Coreg 12.5 BID      6. Bigeminy :  Stable .  She had 1 PVC in ECG today   Current medicines are reviewed at length with the patient today.  The patient does not have concerns regarding medicines.  The following changes have been made:  no change  Labs/ tests ordered today include:  No orders of the defined types were placed in this encounter.    Disposition:   FU with me in 6 months .      Kristeen Miss, MD  01/16/2017 10:14 AM    [facility] [address] [place], [place], [place]  [address] Phone: [phone]; Fax: [phone]

## 2017-01-16 NOTE — Patient Instructions (Signed)
Medication Instructions:    Your physician recommends that you continue on your current medications as directed. Please refer to the Current Medication list given to you today.  --- If you need a refill on your cardiac medications before your next appointment, please call your pharmacy. ---  Labwork:  None ordered  Testing/Procedures:  None ordered  Follow-Up:  Your physician wants you to follow-up in: 6 months with Dr. Nahser.  You will receive a reminder letter in the mail two months in advance. If you don't receive a letter, please call our office to schedule the follow-up appointment.  Thank you for choosing [facility]!!            

## 2017-02-18 ENCOUNTER — Other Ambulatory Visit: Payer: Self-pay | Admitting: Cardiovascular Disease

## 2017-06-13 ENCOUNTER — Other Ambulatory Visit: Payer: Self-pay | Admitting: Cardiovascular Disease

## 2017-07-10 ENCOUNTER — Other Ambulatory Visit: Payer: Self-pay | Admitting: Cardiovascular Disease

## 2017-07-22 ENCOUNTER — Other Ambulatory Visit: Payer: Self-pay | Admitting: Cardiovascular Disease

## 2017-08-14 ENCOUNTER — Other Ambulatory Visit: Payer: Self-pay | Admitting: Cardiovascular Disease

## 2017-08-19 ENCOUNTER — Encounter: Payer: Self-pay | Admitting: Cardiovascular Disease

## 2017-08-26 ENCOUNTER — Encounter: Payer: Self-pay | Admitting: Cardiovascular Disease

## 2017-08-26 ENCOUNTER — Ambulatory Visit (INDEPENDENT_AMBULATORY_CARE_PROVIDER_SITE_OTHER): Payer: Medicare Other | Admitting: Cardiovascular Disease

## 2017-08-26 VITALS — BP 160/90 | HR 61 | Ht 64.0 in | Wt 159.0 lb

## 2017-08-26 DIAGNOSIS — E782 Mixed hyperlipidemia: Secondary | ICD-10-CM

## 2017-08-26 DIAGNOSIS — I2510 Atherosclerotic heart disease of native coronary artery without angina pectoris: Secondary | ICD-10-CM | POA: Diagnosis not present

## 2017-08-26 DIAGNOSIS — I10 Essential (primary) hypertension: Secondary | ICD-10-CM | POA: Diagnosis not present

## 2017-08-26 LAB — BASIC METABOLIC PANEL
BUN/Creatinine Ratio: 22 (ref 12–28)
BUN: 21 mg/dL (ref 8–27)
CALCIUM: 9 mg/dL (ref 8.7–10.3)
CO2: 26 mmol/L (ref 20–29)
CREATININE: 0.94 mg/dL (ref 0.57–1.00)
Chloride: 107 mmol/L — ABNORMAL HIGH (ref 96–106)
GFR, EST AFRICAN AMERICAN: 64 mL/min/{1.73_m2} (ref >59)
GFR, EST NON AFRICAN AMERICAN: 56 mL/min/{1.73_m2} — AB (ref >59)
Glucose: 87 mg/dL (ref 65–99)
POTASSIUM: 4.5 mmol/L (ref 3.5–5.2)
Sodium: 145 mmol/L — ABNORMAL HIGH (ref 134–144)

## 2017-08-26 LAB — LIPID PANEL
CHOLESTEROL TOTAL: 151 mg/dL (ref 100–199)
Chol/HDL Ratio: 2.3 ratio (ref 0.0–4.4)
HDL: 65 mg/dL (ref >39)
LDL Calculated: 72 mg/dL (ref 0–99)
TRIGLYCERIDES: 69 mg/dL (ref 0–149)
VLDL Cholesterol Cal: 14 mg/dL (ref 5–40)

## 2017-08-26 LAB — HEPATIC FUNCTION PANEL
ALBUMIN: 3.8 g/dL (ref 3.5–4.7)
ALT: 11 IU/L (ref 0–32)
AST: 13 IU/L (ref 0–40)
Alkaline Phosphatase: 87 IU/L (ref 39–117)
BILIRUBIN TOTAL: 0.4 mg/dL (ref 0.0–1.2)
BILIRUBIN, DIRECT: 0.12 mg/dL (ref 0.00–0.40)
Total Protein: 6.6 g/dL (ref 6.0–8.5)

## 2017-08-26 NOTE — Progress Notes (Signed)
Cardiology Office Note   Date:  08/26/2017   ID:  Kerri Sosa, Kerri Sosa 08-17-1930, MRN [id number]  PCP:  Richmond Campbell., PA-C  Cardiologist:   Kristeen Miss, MD   Chief Complaint  Patient presents with  . Coronary Artery Disease   1. Dyspnea 2. Aortic stenosis - mild , for cardiac catheterization in August, 2014 revealed a peak to peak aortic valve gradient of 7 mmHg. In the past her Aortic stenosis has been graded as mild using echo. 3. Mitral regurgitation 4. CAD - s/p Inf. STEMI, s/p BMS stenting of her mid RCA. 4.0 stent dilated to 5.0 mm. ( Aug. 8, 2014) 5. Hypertension  History of Present Illness:  Kerri Sosa is a 84 yo with hx of mild AS and MR. 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 She does not get any regular exercise.  She denies any syncopal episodes. She denies any chest pain.  Sept. 24, 2014:  Kerri Sosa was out of town and had an inferior wall ST segment elevation myocardial infarction. She was on vacation at [facility] and started having episodes of midsternal pain. She was transferred to [facility] and taken to [facility]. She had an urgent cardiac catheterization which revealed an occluded mid right coronary artery. She had a 4.0 x 20 mm bare-metal stent placed in her mid right coronary artery. He was post dilated up to 5.0 mm with a noncompliant balloon.  She is doing well at this point. No CP. She still has some dyspnea.   She's eager to get back to work. She's not had any severe complications. She measures her blood pressure on regular basis and her readings are typically in the normal range.  Dec. 4, 2014:  No cardiac symptoms. Has had a head cold.   December 14, 2013:  Kerri Sosa is doing well. She is going to visit her daughter at [facility] next week. No angina pain. 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   No CP, dyspnea, or presyncope.  Nov. 17  ,2015:  Kerri Sosa is a 84 y.o. female who I follow for her CAD ( MI while in [place], [place] Aug. 2014) and her aortic stenosis.   Kerri Sosa is doing well. Still works at her church. Goes up and down stairs without problems. Occasionally out of breath at the top of the stairs.  No Cp , no dizziness.   BP has been normal at home.  A bit elevated today.    Nov 28 2014:   Kerri Sosa is a 84 y.o. female who presents for follow up of CAD No cp BP is a bit elevated at home. BP at home is normal .     Nov. 14, 2016:  Able to do all of her normal activities. Just got back from fishing at the beach.    No CP or dyspnea.  BP is here in the office.  Was normal at home .  She watches her salt .   May 17 ,2017:  Doing well . No CP , no dyspnea.  Able to do all of her normal activities  BP is a bit high   Nov. 15, 2017:  Doing well.   Is short of breath.   Has DOE  She cleans the church every week.   She hauls  wood in the winter   January 16, 2017:  Kerri Sosa is seen today for follow up  of her CAD . Has very mild aortic stenosis.   Feb. 12, 2019:  Doing well. Staying active.  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  Past Medical History:  Diagnosis Date  . Crohn's disease (HCC)   . Heart attack (HCC)   . Hiatal hernia   . Hypertension     Past Surgical History:  Procedure Laterality Date  . APPENDECTOMY    . HERNIA REPAIR       Current Outpatient Medications  Medication Sig Dispense Refill  . amLODipine-valsartan (EXFORGE) 5-160 MG tablet Take 1 tablet by mouth daily. 90 tablet 3  . aspirin EC 81 MG tablet Take 81 mg by mouth daily.    . carvedilol (COREG) 12.5 MG tablet TAKE ONE TABLET BY MOUTH TWICE DAILY 60 tablet 9  . clopidogrel (PLAVIX) 75 MG tablet TAKE ONE TABLET BY MOUTH EVERY DAY 90 tablet 1  . hydrochlorothiazide (HYDRODIURIL) 25 MG tablet TAKE ONE TABLET BY MOUTH EVERY DAY 30 tablet 6  . Lansoprazole (PREVACID PO) Take 1 capsule  by mouth at bedtime.    . naproxen sodium (ALEVE) 220 MG tablet Take 220 mg by mouth daily.    . nitroGLYCERIN (NITROSTAT) 0.4 MG SL tablet Place 1 tablet (0.4 mg total) under the tongue every 5 (five) minutes as needed for chest pain. Max three doses. 25 tablet 5  . potassium chloride SA (K-DUR,KLOR-CON) 20 MEQ tablet TAKE ONE TABLET BY MOUTH EVERY DAY 30 tablet 5  . pravastatin (PRAVACHOL) 40 MG tablet Take 1 tablet (40 mg total) by mouth daily. 90 tablet 3   No current facility-administered medications for this visit.     Allergies:   Lipitor [atorvastatin]    Social History:  The patient  reports that  has never smoked. she has never used smokeless tobacco. She reports that she does not drink alcohol or use drugs.   Family History:  The patient's family history includes Cancer in her father; Heart disease in her mother.    ROS:   Noted in current history, all other systems are negative.   Physical Exam: Blood pressure (!) 160/90, pulse 61, height 5\' 4"  (1.626 m), weight 159 lb (72.1 kg), SpO2 94 %.  GEN:  Well nourished, well developed in no acute distress HEENT: Normal NECK: No JVD; No carotid bruits LYMPHATICS: No lymphadenopathy CARDIAC: RR,  Soft systolic murmur  RESPIRATORY:  Clear to auscultation without rales, wheezing or rhonchi  ABDOMEN: Soft, non-tender, non-distended MUSCULOSKELETAL:  No edema; No deformity  SKIN: Warm and dry NEUROLOGIC:  Alert and oriented x 3   EKG: August 26, 2017: Ectopic atrial rhythm with a heart rate of 61.  Nonspecific ST and T wave abnormalities.   Recent Labs: 01/09/2017: ALT 9; BUN 21; Creatinine, Ser 0.98; Potassium 4.3; Sodium 144    Lipid Panel    Component Value Date/Time   CHOL 155 01/09/2017 1004   TRIG 71 01/09/2017 1004   HDL 69 01/09/2017 1004   CHOLHDL 2.2 01/09/2017 1004   CHOLHDL 1.8 11/29/2015 0842   VLDL 11 11/29/2015 0842   LDLCALC 72 01/09/2017 1004      Wt Readings from Last 3 Encounters:  08/26/17  159 lb (72.1 kg)  01/16/17 157 lb 6.4 oz (71.4 kg)  05/29/16 160 lb 3.2 oz (72.7 kg)      Other studies Reviewed: Additional studies/ records that were reviewed today include: . Review of the above records demonstrates:    ASSESSMENT AND PLAN:  1. Dyspnea-  Doing well, very stable   2. Aortic stenosis  -  Has mild AS , mild MR  3. Mitral regurgitation  4. CAD - s/p Inf. STEMI, s/p BMS stenting of her mid RCA. 4.0 stent dilated to 5.0 mm. ( Aug. 8, 2014).  No angina  Continue ASA 81 mg a day and plavix 75 mg a day     5. Hypertension -    HCTZ 25 a day , Kdur 20 meq a day   Coreg 12.5 BID   BP is typically well controlled.   She has not taken her BP meds this am so her BP is a bit elevated today        Current medicines are reviewed at length with the patient today.  The patient does not have concerns regarding medicines.  The following changes have been made:  no change  Labs/ tests ordered today include:  No orders of the defined types were placed in this encounter.   Disposition:   FU with me in 6 months .      Kristeen Miss, MD  08/26/2017 10:55 AM    [facility] [address] [place], [place], [place]  [address] Phone: [phone]; Fax: [phone]

## 2017-08-26 NOTE — Patient Instructions (Signed)
Medication Instructions:  Your physician recommends that you continue on your current medications as directed. Please refer to the Current Medication list given to you today.   Labwork: TODAY - liver panel, cholesterol, basic metabolic panel   Testing/Procedures: None Ordered   Follow-Up: Your physician wants you to follow-up in: 6 months with Dr. Nahser. You will receive a reminder letter in the mail two months in advance. If you don't receive a letter, please call our office to schedule the follow-up appointment.   If you need a refill on your cardiac medications before your next appointment, please call your pharmacy.   Thank you for choosing [facility]! Sharan Mcenaney, RN [phone]    

## 2018-01-30 ENCOUNTER — Other Ambulatory Visit: Payer: Self-pay | Admitting: *Deleted

## 2018-01-30 MED ORDER — CLOPIDOGREL BISULFATE 75 MG PO TABS: 75.0000 mg | ORAL_TABLET | Freq: Every day | ORAL | 2 refills | Status: DC

## 2018-01-30 MED ORDER — HYDROCHLOROTHIAZIDE 25 MG PO TABS: 25.0000 mg | ORAL_TABLET | Freq: Every day | ORAL | 6 refills | Status: DC

## 2018-03-18 ENCOUNTER — Telehealth: Payer: Self-pay | Admitting: Cardiovascular Disease

## 2018-03-18 ENCOUNTER — Other Ambulatory Visit: Payer: Self-pay

## 2018-03-18 ENCOUNTER — Ambulatory Visit (INDEPENDENT_AMBULATORY_CARE_PROVIDER_SITE_OTHER): Payer: Medicare Other | Admitting: Cardiovascular Disease

## 2018-03-18 ENCOUNTER — Encounter: Payer: Self-pay | Admitting: Cardiovascular Disease

## 2018-03-18 VITALS — BP 152/74 | HR 63 | Ht 64.0 in | Wt 160.0 lb

## 2018-03-18 DIAGNOSIS — E782 Mixed hyperlipidemia: Secondary | ICD-10-CM

## 2018-03-18 DIAGNOSIS — I2510 Atherosclerotic heart disease of native coronary artery without angina pectoris: Secondary | ICD-10-CM

## 2018-03-18 DIAGNOSIS — I10 Essential (primary) hypertension: Secondary | ICD-10-CM | POA: Diagnosis not present

## 2018-03-18 LAB — BASIC METABOLIC PANEL
BUN / CREAT RATIO: 25 (ref 12–28)
BUN: 26 mg/dL (ref 8–27)
CALCIUM: 9.2 mg/dL (ref 8.7–10.3)
CO2: 25 mmol/L (ref 20–29)
Chloride: 102 mmol/L (ref 96–106)
Creatinine, Ser: 1.02 mg/dL — ABNORMAL HIGH (ref 0.57–1.00)
GFR, EST AFRICAN AMERICAN: 58 mL/min/{1.73_m2} — AB (ref >59)
GFR, EST NON AFRICAN AMERICAN: 50 mL/min/{1.73_m2} — AB (ref >59)
Glucose: 94 mg/dL (ref 65–99)
POTASSIUM: 4.1 mmol/L (ref 3.5–5.2)
Sodium: 144 mmol/L (ref 134–144)

## 2018-03-18 LAB — HEPATIC FUNCTION PANEL
ALBUMIN: 3.9 g/dL (ref 3.5–4.7)
ALK PHOS: 92 IU/L (ref 39–117)
ALT: 9 IU/L (ref 0–32)
AST: 15 IU/L (ref 0–40)
Bilirubin Total: 0.4 mg/dL (ref 0.0–1.2)
Bilirubin, Direct: 0.1 mg/dL (ref 0.00–0.40)
Total Protein: 6.9 g/dL (ref 6.0–8.5)

## 2018-03-18 LAB — LIPID PANEL
CHOL/HDL RATIO: 2.5 ratio (ref 0.0–4.4)
CHOLESTEROL TOTAL: 180 mg/dL (ref 100–199)
HDL: 72 mg/dL (ref >39)
LDL CALC: 90 mg/dL (ref 0–99)
TRIGLYCERIDES: 89 mg/dL (ref 0–149)
VLDL Cholesterol Cal: 18 mg/dL (ref 5–40)

## 2018-03-18 MED ORDER — POTASSIUM CHLORIDE CRYS ER 20 MEQ PO TBCR: 20.0000 meq | EXTENDED_RELEASE_TABLET | Freq: Every day | ORAL | 3 refills | Status: DC

## 2018-03-18 NOTE — Telephone Encounter (Signed)
New Message        Patient recv'd a Rx with the wrong state on it, [facility]  [place] [facility] [place] PLEASE CORRECT.Marland KitchenMarland Kitchen

## 2018-03-18 NOTE — Patient Instructions (Signed)
Medication Instructions:  Your physician has recommended you make the following change in your medication:   STOP Prevacid START Pepcid OTC 10 mg per day or Zantac 150 mg once daily    Labwork: TODAY - cholesterol, liver panel, basic metabolic panel   Testing/Procedures: None Ordered   Follow-Up: Your physician wants you to follow-up in: 6 months with Dr. Elease Hashimoto.  You will receive a reminder letter in the mail two months in advance. If you don't receive a letter, please call our office to schedule the follow-up appointment.   If you need a refill on your cardiac medications before your next appointment, please call your pharmacy.   Thank you for choosing [facility]! Eligha Bridegroom, RN [phone]

## 2018-03-18 NOTE — Telephone Encounter (Signed)
This has been addressed.

## 2018-03-18 NOTE — Progress Notes (Signed)
Cardiology Office Note   Date:  03/18/2018   ID:  Kerri Sosa, Kerri Sosa [birth year], MRN [id number]  PCP:  Richmond Campbell., PA-C  Cardiologist:   Kristeen Miss, MD   Chief Complaint  Patient presents with  . Coronary Artery Disease   1. Dyspnea 2. Aortic stenosis - mild , for cardiac catheterization in August, 2014 revealed a peak to peak aortic valve gradient of 7 mmHg. In the past her Aortic stenosis has been graded as mild using echo. 3. Mitral regurgitation 4. CAD - s/p Inf. STEMI, s/p BMS stenting of her mid RCA. 4.0 stent dilated to 5.0 mm. ( Aug. 8, 2014), [place], [place]  5. Hypertension  History of Present Illness:  Ms. [name] is a 84 yo with hx of mild AS and MR. 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 She does not get any regular exercise.  She denies any syncopal episodes. She denies any chest pain.  Sept. 24, 2014:  Kerri Sosa was out of town and had an inferior wall ST segment elevation myocardial infarction. She was on vacation at [facility] and started having episodes of midsternal pain. She was transferred to [facility] and taken to [facility]. She had an urgent cardiac catheterization which revealed an occluded mid right coronary artery. She had a 4.0 x 20 mm bare-metal stent placed in her mid right coronary artery. He was post dilated up to 5.0 mm with a noncompliant balloon.  She is doing well at this point. No CP. She still has some dyspnea.   She's eager to get back to work. She's not had any severe complications. She measures her blood pressure on regular basis and her readings are typically in the normal range.  Dec. 4, 2014:  No cardiac symptoms. Has had a head cold.   December 14, 2013:  Kerri Sosa is doing well. She is going to visit her daughter at [facility] next week. No angina pain. 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   No CP, dyspnea, or  presyncope.  Nov. 17 ,2015:  Kerri Sosa is a 84 y.o. female who I follow for her CAD ( MI while in [place], [place] Aug. 2014) and her aortic stenosis.   [name] is doing well. Still works at her church. Goes up and down stairs without problems. Occasionally out of breath at the top of the stairs.  No Cp , no dizziness.   BP has been normal at home.  A bit elevated today.    Nov 28 2014:   Kerri Sosa is a 84 y.o. female who presents for follow up of CAD No cp BP is a bit elevated at home. BP at home is normal .     Nov. 14, 2016:  Able to do all of her normal activities. Just got back from fishing at the beach.    No CP or dyspnea.  BP is here in the office.  Was normal at home .  She watches her salt .   May 17 ,2017:  Doing well . No CP , no dyspnea.  Able to do all of her normal activities  BP is a bit high   Nov. 15, 2017:  Doing well.   Is short of breath.   Has DOE  She cleans the church every week.   She hauls  wood in the winter   January 16, 2017:  [name] is seen today  for follow up of her CAD . Has very mild aortic stenosis.   Feb. 12, 2019:  Doing well. Staying active.  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  Sept. 4, 2019:  Still active. 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 Has some chest heaviness, occurs after eating .  Not necessarliy associated with exertion Recently had an episode that lasted all day  Does not take NTG or antiacid  Had an MI in [place] - had a stent placed BP is a bit high today  - has not had her meds today    Past Medical History:  Diagnosis Date  . Crohn's disease (HCC)   . Heart attack (HCC)   . Hiatal hernia   . Hypertension     Past Surgical History:  Procedure Laterality Date  . APPENDECTOMY    . HERNIA REPAIR       Current Outpatient Medications  Medication Sig Dispense Refill  . amLODipine-valsartan (EXFORGE) 5-160 MG tablet Take 1 tablet by mouth daily. 90 tablet 3  . aspirin EC 81 MG  tablet Take 81 mg by mouth daily.    . carvedilol (COREG) 12.5 MG tablet TAKE ONE TABLET BY MOUTH TWICE DAILY 60 tablet 9  . clopidogrel (PLAVIX) 75 MG tablet Take 1 tablet (75 mg total) by mouth daily. 90 tablet 2  . hydrochlorothiazide (HYDRODIURIL) 25 MG tablet Take 1 tablet (25 mg total) by mouth daily. 30 tablet 6  . Lansoprazole (PREVACID PO) Take 1 capsule by mouth at bedtime.    . naproxen sodium (ALEVE) 220 MG tablet Take 220 mg by mouth daily.    . nitroGLYCERIN (NITROSTAT) 0.4 MG SL tablet Place 1 tablet (0.4 mg total) under the tongue every 5 (five) minutes as needed for chest pain. Max three doses. 25 tablet 5  . potassium chloride SA (K-DUR,KLOR-CON) 20 MEQ tablet TAKE ONE TABLET BY MOUTH EVERY DAY 30 tablet 5  . pravastatin (PRAVACHOL) 40 MG tablet Take 1 tablet (40 mg total) by mouth daily. 90 tablet 3   No current facility-administered medications for this visit.     Allergies:   Lipitor [atorvastatin]    Social History:  The patient  reports that she has never smoked. She has never used smokeless tobacco. She reports that she does not drink alcohol or use drugs.   Family History:  The patient's family history includes Cancer in her father; Heart disease in her mother.    ROS:   Noted in current history, all other systems are negative.   Physical Exam: Blood pressure (!) 152/74, pulse 63, height 5\' 4"  (1.626 m), weight 160 lb (72.6 kg), SpO2 98 %.  GEN:   Elderly female,  NAD  HEENT: Normal NECK: No JVD; No carotid bruits LYMPHATICS: No lymphadenopathy CARDIAC: RRR ,  Soft systolic murmur  RESPIRATORY:  Clear to auscultation without rales, wheezing or rhonchi  ABDOMEN: Soft, non-tender, non-distended MUSCULOSKELETAL:  No edema; No deformity  SKIN: Warm and dry NEUROLOGIC:  Alert and oriented x 3  EKG:     Recent Labs: 08/26/2017: ALT 11; BUN 21; Creatinine, Ser 0.94; Potassium 4.5; Sodium 145    Lipid Panel    Component Value Date/Time   CHOL 151  08/26/2017 1111   TRIG 69 08/26/2017 1111   HDL 65 08/26/2017 1111   CHOLHDL 2.3 08/26/2017 1111   CHOLHDL 1.8 11/29/2015 0842   VLDL 11 11/29/2015 0842   LDLCALC 72 08/26/2017 1111      Wt Readings from  Last 3 Encounters:  03/18/18 160 lb (72.6 kg)  08/26/17 159 lb (72.1 kg)  01/16/17 157 lb 6.4 oz (71.4 kg)      Other studies Reviewed: Additional studies/ records that were reviewed today include: . Review of the above records demonstrates:    ASSESSMENT AND PLAN:  1. Dyspnea-     Symptoms are better   2. Aortic stenosis  -   stable  3. Mitral regurgitation  4. CAD - s/p Inf. STEMI, s/p BMS stenting of her mid RCA. 4.0 stent dilated to 5.0 mm. ( Aug. 8, 2014).    She has not had any episodes of angina.  She did have an episode of chest tightness that lasted all day.  She thinks it might be related to her blood pressure or perhaps what she ate.  The chest tightness is not exertional.  She will continue to monitor.  I encouraged her to take a nitroglycerin if she has recurrent chest tightness.    5. Hypertension -    HCTZ 25 a day , Kdur 20 meq a day   Coreg 12.5 BID   Blood pressures a little high today.  She has not taken her medications.  I have advised her to stay away from eating too much salt.         Current medicines are reviewed at length with the patient today.  The patient does not have concerns regarding medicines.  The following changes have been made:  no change  Labs/ tests ordered today include:  No orders of the defined types were placed in this encounter.   Disposition:   FU with me in 6 months .      Kristeen Miss, MD  03/18/2018 9:08 AM    [facility] [address] [place], [place], [place]  [address] Phone: [phone]; Fax: [phone]

## 2018-04-06 ENCOUNTER — Ambulatory Visit
Admission: RE | Admit: 2018-04-06 | Discharge: 2018-04-06 | Disposition: A | Discharge status: Home / Self Care | Payer: Medicare Other | Source: Ambulatory Visit | Attending: Family Medicine | Admitting: Family Medicine

## 2018-04-06 ENCOUNTER — Other Ambulatory Visit: Payer: Self-pay | Admitting: Family Medicine

## 2018-04-06 DIAGNOSIS — R0602 Shortness of breath: Secondary | ICD-10-CM

## 2018-04-08 ENCOUNTER — Other Ambulatory Visit: Payer: Self-pay | Admitting: Cardiovascular Disease

## 2018-05-15 ENCOUNTER — Other Ambulatory Visit (INDEPENDENT_AMBULATORY_CARE_PROVIDER_SITE_OTHER): Payer: Medicare Other

## 2018-05-15 ENCOUNTER — Ambulatory Visit (INDEPENDENT_AMBULATORY_CARE_PROVIDER_SITE_OTHER): Payer: Medicare Other | Admitting: Internal Medicine

## 2018-05-15 ENCOUNTER — Encounter: Payer: Self-pay | Admitting: Internal Medicine

## 2018-05-15 VITALS — BP 124/72 | HR 61 | Ht 64.0 in | Wt 160.6 lb

## 2018-05-15 DIAGNOSIS — R05 Cough: Secondary | ICD-10-CM | POA: Diagnosis not present

## 2018-05-15 DIAGNOSIS — R0609 Other forms of dyspnea: Secondary | ICD-10-CM | POA: Diagnosis not present

## 2018-05-15 DIAGNOSIS — R058 Other specified cough: Secondary | ICD-10-CM

## 2018-05-15 DIAGNOSIS — I2510 Atherosclerotic heart disease of native coronary artery without angina pectoris: Secondary | ICD-10-CM

## 2018-05-15 LAB — CBC WITH DIFFERENTIAL/PLATELET
BASOS ABS: 0 10*3/uL (ref 0.0–0.1)
Basophils Relative: 0.6 % (ref 0.0–3.0)
Eosinophils Absolute: 0.3 10*3/uL (ref 0.0–0.7)
Eosinophils Relative: 3.8 % (ref 0.0–5.0)
HEMATOCRIT: 37.6 % (ref 36.0–46.0)
HEMOGLOBIN: 12.1 g/dL (ref 12.0–15.0)
LYMPHS PCT: 15.1 % (ref 12.0–46.0)
Lymphs Abs: 1.1 10*3/uL (ref 0.7–4.0)
MCHC: 32.3 g/dL (ref 30.0–36.0)
MCV: 83.5 fl (ref 78.0–100.0)
MONOS PCT: 8.2 % (ref 3.0–12.0)
Monocytes Absolute: 0.6 10*3/uL (ref 0.1–1.0)
NEUTROS PCT: 72.3 % (ref 43.0–77.0)
Neutro Abs: 5.4 10*3/uL (ref 1.4–7.7)
Platelets: 257 10*3/uL (ref 150.0–400.0)
RBC: 4.5 Mil/uL (ref 3.87–5.11)
RDW: 15.5 % (ref 11.5–15.5)
WBC: 7.5 10*3/uL (ref 4.0–10.5)

## 2018-05-15 LAB — BRAIN NATRIURETIC PEPTIDE: PRO B NATRI PEPTIDE: 119 pg/mL — AB (ref 0.0–100.0)

## 2018-05-15 LAB — TSH: TSH: 1.57 u[IU]/mL (ref 0.35–4.50)

## 2018-05-15 NOTE — Patient Instructions (Addendum)
Try zyrtec 10 mg one at bedtime over the counter - if not helping the dripping go back to clariton   Please remember to go to the lab department downstairs in the basement  for your tests - we will call you with the results when they are available.      Please schedule a follow up office visit in 4 weeks, sooner if needed  with all medications /inhalers/ solutions in hand so we can verify exactly what you are taking. This includes all medications from all doctors and over the counters - PFT's on return but DON'T use your advair that day - add:  Pantoprazole (protonix) 40 mg   Take  30-60 min before first meal of the day and Pepcid (famotidine)  20 mg one @  bedtime until return to office - this is the best way to tell whether stomach acid is contributing to your problem.

## 2018-05-15 NOTE — Progress Notes (Signed)
Kerri Sosa, female    DOB: [birth year],    MRN: [id number]    Brief patient profile:  47 yowf never smoker with h/o IHD s/p MI around 2014 and breathing was back to nl until fall 2018 with indolent  onset of doe she assoc c onset of watery rhinitis/pnds  and subjective wheeze/ am cough rx clariton /advair helped some but not back to baseline so referred to pulmonary clinic 05/15/2018 by Dr   Mady Gemma    History of Present Illness  05/15/2018  1st office eval/ Kerri Sosa   Chief Complaint  Patient presents with  . Pulmonary Consult    Referred by Dr. Arlyce Dice. Pt c/o SOB x 6 months. She gets winded taking a shower and getting dressed.   Dyspnea:   MMRC3 = can't walk 100 yards even at a slow pace at a flat grade s stopping due to sob  This problem  No better on advair Cough: better drainage since clariton /advair   Day >> noct assoc overt HB Sleep: ok one pillow  SABA use: none   No obvious day to day or daytime variability or assoc excess/ purulent sputum or mucus plugs or hemoptysis or cp or chest tightness, subjective wheeze or overt sinus symptoms.   Sleeps fine as above  without nocturnal  or early am exacerbation  of respiratory  c/o's or need for noct saba. Also denies any obvious fluctuation of symptoms with weather or environmental changes or other aggravating or alleviating factors except as outlined above   No unusual exposure hx or h/o childhood pna/ asthma or knowledge of premature birth.  Current Allergies, Complete Past Medical History, Past Surgical History, Family History, and Social History were reviewed in [facility] record.  ROS  The following are not active complaints unless bolded Hoarseness, sore throat, dysphagia, dental problems, itching, sneezing,  nasal congestion or discharge of excess mucus or purulent secretions, ear ache,   fever, chills, sweats, unintended wt loss or wt gain, classically pleuritic or exertional cp,  orthopnea pnd or  arm/hand swelling  or leg swelling, presyncope, palpitations, abdominal pain, anorexia, nausea, vomiting, diarrhea  or change in bowel habits or change in bladder habits, change in stools or change in urine, dysuria, hematuria,  rash, arthralgias, visual complaints, headache, numbness, weakness or ataxia or problems with walking or coordination,  change in mood or  memory.           Past Medical History:  Diagnosis Date  . Crohn's disease (HCC)   . Heart attack (HCC)   . Hiatal hernia   . Hypertension     Outpatient Medications Prior to Visit  Medication Sig Dispense Refill  . ADVAIR DISKUS 100-50 MCG/DOSE AEPB Inhale 1 puff into the lungs 2 (two) times daily.  3  . amLODipine-valsartan (EXFORGE) 5-160 MG tablet Take 1 tablet by mouth daily. 90 tablet 3  . aspirin EC 81 MG tablet Take 81 mg by mouth daily.    . carvedilol (COREG) 12.5 MG tablet TAKE ONE TABLET BY MOUTH TWICE DAILY 180 tablet 3  . clopidogrel (PLAVIX) 75 MG tablet Take 1 tablet (75 mg total) by mouth daily. 90 tablet 2  . hydrochlorothiazide (HYDRODIURIL) 25 MG tablet Take 1 tablet (25 mg total) by mouth daily. 30 tablet 6  . loratadine (CLARITIN) 10 MG tablet Take 10 mg by mouth daily.    . naproxen sodium (ALEVE) 220 MG tablet Take 220 mg by mouth daily.    Marland Kitchen  nitroGLYCERIN (NITROSTAT) 0.4 MG SL tablet Place 1 tablet (0.4 mg total) under the tongue every 5 (five) minutes as needed for chest pain. Max three doses. 25 tablet 5  . potassium chloride SA (K-DUR,KLOR-CON) 20 MEQ tablet Take 1 tablet (20 mEq total) by mouth daily. 90 tablet 3  . pravastatin (PRAVACHOL) 40 MG tablet Take 1 tablet (40 mg total) by mouth daily. 90 tablet 3              Objective:     BP 124/72 (BP Location: Left Arm, Cuff Size: Normal)   Pulse 61   Ht 5\' 4"  (1.626 m)   Wt 160 lb 9.6 oz (72.8 kg)   SpO2 97%   BMI 27.57 kg/m   SpO2: 97 %  RA  amb wf voice fatigue  Top denture Kyphotic chest wall   HEENT:  Top dentures/  Nl   turbinates bilaterally, and oropharynx s pnds. Nl external ear canals without cough reflex   NECK :  without JVD/Nodes/TM/ nl carotid upstrokes bilaterally   LUNGS: no acc muscle use,  Mod T kyphotic contour chest which is clear to A and P bilaterally without cough on insp or exp maneuvers   CV:  RRR  no s3  II-III/VI SEM but no increase in P2, and no edema   ABD:  soft and nontender with nl inspiratory excursion in the supine position. No bruits or organomegaly appreciated, bowel sounds nl  MS:  Nl gait/ ext warm without deformities, calf tenderness, cyanosis or clubbing No obvious joint restrictions   SKIN: warm and dry without lesions    NEURO:  alert, approp, nl sensorium with  no motor or cerebellar deficits apparent.    I personally reviewed images and agree with radiology impression as follows:  CXR:   04/06/18 Negative for acute cardiopulmonary disease. Hiatal hernia.   Labs ordered/ reviewed:      Chemistry      Component Value Date/Time   NA 144 03/18/2018 0926   K 4.1 03/18/2018 0926   CL 102 03/18/2018 0926   CO2 25 03/18/2018 0926   BUN 26 03/18/2018 0926   CREATININE 1.02 (H) 03/18/2018 0926   CREATININE 0.90 (H) 11/29/2015 0842      Component Value Date/Time   CALCIUM 9.2 03/18/2018 0926   ALKPHOS 92 03/18/2018 0926   AST 15 03/18/2018 0926   ALT 9 03/18/2018 0926   BILITOT 0.4 03/18/2018 0926        Lab Results  Component Value Date   WBC 7.5 05/15/2018   HGB 12.1 05/15/2018   HCT 37.6 05/15/2018   MCV 83.5 05/15/2018   PLT 257.0 05/15/2018       EOS                                                              0.3                                      05/15/2018       Lab Results  Component Value Date   TSH 1.57 05/15/2018     Lab Results  Component Value Date   PROBNP 119.0 (H) 05/15/2018  Labs ordered 05/15/2018  Allergy profile         Assessment   DOE (dyspnea on exertion) Echo 06/05/2015 - Normal LV size with  mild LV hypertrophy. EF 60-65%. Normal RV   size and systolic function. Moderate LAE. Mild aortic stenosis. - 05/15/2018  Walked RA x 3 laps @ 185 ft each stopped due to  End of study, nl pace, no  desat - min sob      Symptoms are markedly disproportionate to objective findings and not clear to what extent this is actually a pulmonary  problem but pt does appear to have difficult to sort out respiratory symptoms of unknown origin for which  DDX  = almost all start with A and  include Adherence, Ace Inhibitors, Acid Reflux, Active Sinus Disease, Alpha 1 Antitripsin deficiency, Anxiety masquerading as Airways dz,  ABPA,  Allergy(esp in young), Aspiration (esp in elderly), Adverse effects of meds,  Active smoking or Vaping, A bunch of PE's/clot burden (a few small clots can't cause this syndrome unless there is already severe underlying pulm or vascular dz with poor reserve),  Anemia or thyroid disorder, plus two Bs  = Bronchiectasis and Beta blocker use..and one C= CHF    Adherence is always the initial "prime suspect" and is a multilayered concern that requires a "trust but verify" approach in every patient - starting with knowing how to use medications, especially inhalers, correctly, keeping up with refills and understanding the fundamental difference between maintenance and prns vs those medications only taken for a very short course and then stopped and not refilled.  - return with all meds in hand using a trust but verify approach to confirm accurate Medication  Reconciliation The principal here is that until we are certain that the  patients are doing what we've asked, it makes no sense to ask them to do more.   ? Allergy /asthma vs uacs > seen profile, see uacs / change to zytec and return for pfts before and and after saba   ? Acid (or non-acid) GERD > always difficult to exclude as up to 75% of pts in some series report no assoc GI/ Heartburn symptoms and she has large HH with heartburn symptoms  and non-specific upper airway complaints > rec max (24h)  acid suppression and diet restrictions/ reviewed and instructions given in writing.   ? Adverse effects of meds > none of the usual suspects listed though I wonder whether advair contributing to uacs  (see separate a/p)   ? BB effects > probably not an issue but ideally In the setting of respiratory symptoms of unknown etiology,  I usually rec   bystolic, the most beta -1  selective Beta blocker available in sample form, with bisoprolol the most selective generic choice  on the market, at least on a trial basis, to make sure the spillover Beta 2 effects of the less specific Beta blockers are not contributing to this patient's symptoms.  For now will leave on coreg / defer to Mady Gemma  ? CHF related to ihd or AS > bnp reassuring as is absence of orthopnea/ leg swelling but if w/u otherwise  neg and not responding to rx  would consider repeat echo   >>> add gerd rx/ change to zyrtec and regroup in 4 weeks with pfts           Upper airway cough syndrome Add gerd rx 05/15/2018  - Allergy profile 05/15/2018 >  Eos 0.3 /  IgE  Upper airway cough syndrome (previously labeled PNDS),  is so named because it's frequently impossible to sort out how much is  CR/sinusitis with freq throat clearing (which can be related to primary GERD)   vs  causing  secondary (" extra esophageal")  GERD from wide swings in gastric pressure that occur with throat clearing, often  promoting self use of mint and menthol lozenges that reduce the lower esophageal sphincter tone and exacerbate the problem further in a cyclical fashion.   These are the same pts (now being labeled as having "irritable larynx syndrome" by some cough centers) who not infrequently have a history of having failed to tolerate ace inhibitors,  dry powder inhalers or biphosphonates or report having atypical/extraesophageal reflux symptoms that don't respond to standard doses of PPI  and are  easily confused as having aecopd or asthma flares by even experienced allergists/ pulmonologists (myself included).    >>> rec try change to zyrtec/ add gerd rx x one month trial    Total time devoted to counseling  > 50 % of initial 60 min office visit:  review case with pt/ discussion of options/alternatives/ personally creating written customized instructions  in presence of pt  then going over those specific  Instructions directly with the pt including how to use all of the meds but in particular covering each new medication in detail and the difference between the maintenance= "automatic" meds and the prns using an action plan format for the latter (If this problem/symptom => do that organization reading Left to right).  Please see AVS from this visit for a full list of these instructions which I personally wrote for this pt and  are unique to this visit.      Sandrea Hughs, MD 05/15/2018

## 2018-05-16 ENCOUNTER — Encounter: Payer: Self-pay | Admitting: Internal Medicine

## 2018-05-16 DIAGNOSIS — R058 Other specified cough: Secondary | ICD-10-CM | POA: Insufficient documentation

## 2018-05-16 DIAGNOSIS — R05 Cough: Secondary | ICD-10-CM | POA: Insufficient documentation

## 2018-05-16 NOTE — Assessment & Plan Note (Addendum)
Echo 06/05/2015 - Normal LV size with mild LV hypertrophy. EF 60-65%. Normal RV   size and systolic function. Moderate LAE. Mild aortic stenosis. - 05/15/2018  Walked RA x 3 laps @ 185 ft each stopped due to  End of study, nl pace, no  desat - min sob      Symptoms are markedly disproportionate to objective findings and not clear to what extent this is actually a pulmonary  problem but pt does appear to have difficult to sort out respiratory symptoms of unknown origin for which  DDX  = almost all start with A and  include Adherence, Ace Inhibitors, Acid Reflux, Active Sinus Disease, Alpha 1 Antitripsin deficiency, Anxiety masquerading as Airways dz,  ABPA,  Allergy(esp in young), Aspiration (esp in elderly), Adverse effects of meds,  Active smoking or Vaping, A bunch of PE's/clot burden (a few small clots can't cause this syndrome unless there is already severe underlying pulm or vascular dz with poor reserve),  Anemia or thyroid disorder, plus two Bs  = Bronchiectasis and Beta blocker use..and one C= CHF    Adherence is always the initial "prime suspect" and is a multilayered concern that requires a "trust but verify" approach in every patient - starting with knowing how to use medications, especially inhalers, correctly, keeping up with refills and understanding the fundamental difference between maintenance and prns vs those medications only taken for a very short course and then stopped and not refilled.  - return with all meds in hand using a trust but verify approach to confirm accurate Medication  Reconciliation The principal here is that until we are certain that the  patients are doing what we've asked, it makes no sense to ask them to do more.   ? Allergy /asthma vs uacs > seen profile, see uacs / change to zytec and return for pfts before and and after saba   ? Acid (or non-acid) GERD > always difficult to exclude as up to 75% of pts in some series report no assoc GI/ Heartburn symptoms and  she has large HH with heartburn symptoms and non-specific upper airway complaints > rec max (24h)  acid suppression and diet restrictions/ reviewed and instructions given in writing.   ? Adverse effects of meds > none of the usual suspects listed though I wonder whether advair contributing to uacs  (see separate a/p)   ? BB effects > probably not an issue but ideally In the setting of respiratory symptoms of unknown etiology,  I usually rec   bystolic, the most beta -1  selective Beta blocker available in sample form, with bisoprolol the most selective generic choice  on the market, at least on a trial basis, to make sure the spillover Beta 2 effects of the less specific Beta blockers are not contributing to this patient's symptoms.  For now will leave on coreg / defer to Mady Gemma  ? CHF related to ihd or AS > bnp reassuring as is absence of orthopnea/ leg swelling but if w/u otherwise  neg and not responding to rx  would consider repeat echo   >>> add gerd rx/ change to zyrtec and regroup in 4 weeks with pfts

## 2018-05-16 NOTE — Assessment & Plan Note (Addendum)
Add gerd rx 05/15/2018  - Allergy profile 05/15/2018 >  Eos 0.3 /  IgE   Upper airway cough syndrome (previously labeled PNDS),  is so named because it's frequently impossible to sort out how much is  CR/sinusitis with freq throat clearing (which can be related to primary GERD)   vs  causing  secondary (" extra esophageal")  GERD from wide swings in gastric pressure that occur with throat clearing, often  promoting self use of mint and menthol lozenges that reduce the lower esophageal sphincter tone and exacerbate the problem further in a cyclical fashion.   These are the same pts (now being labeled as having "irritable larynx syndrome" by some cough centers) who not infrequently have a history of having failed to tolerate ace inhibitors,  dry powder inhalers or biphosphonates or report having atypical/extraesophageal reflux symptoms that don't respond to standard doses of PPI  and are easily confused as having aecopd or asthma flares by even experienced allergists/ pulmonologists (myself included).    >>> rec try change to zyrtec/ add gerd rx x one month trial    Total time devoted to counseling  > 50 % of initial 60 min office visit:  review case with pt/ discussion of options/alternatives/ personally creating written customized instructions  in presence of pt  then going over those specific  Instructions directly with the pt including how to use all of the meds but in particular covering each new medication in detail and the difference between the maintenance= "automatic" meds and the prns using an action plan format for the latter (If this problem/symptom => do that organization reading Left to right).  Please see AVS from this visit for a full list of these instructions which I personally wrote for this pt and  are unique to this visit.

## 2018-05-18 ENCOUNTER — Telehealth: Payer: Self-pay | Admitting: *Deleted

## 2018-05-18 LAB — RESPIRATORY ALLERGY PROFILE REGION II ~~LOC~~
ALLERGEN, CEDAR TREE, T6: 0.13 kU/L — AB
Allergen, A. alternata, m6: <0.1 kU/L
Allergen, Comm Silver Birch, t9: <0.1 kU/L
Allergen, Cottonwood, t14: 0.51 kU/L — ABNORMAL HIGH
Allergen, Oak,t7: <0.1 kU/L
Bermuda Grass: <0.1 kU/L
Box Elder IgE: 0.1 kU/L — ABNORMAL HIGH
CLADOSPORIUM HERBARUM (M2) IGE: <0.1 kU/L
CLASS: 0
CLASS: 0
CLASS: 0
CLASS: 0
CLASS: 0
CLASS: 0
CLASS: 0
COMMON RAGWEED (SHORT) (W1) IGE: 0.17 kU/L — ABNORMAL HIGH
Class: 0
Class: 0
Class: 0
Class: 0
Class: 0
Class: 0
Class: 0
Class: 0
Class: 0
Class: 0
Class: 0
Class: 0
Class: 0
Class: 0
Class: 0
Class: 0
Class: 1
Dog Dander: <0.1 kU/L
Elm IgE: 0.14 kU/L — ABNORMAL HIGH
IgE (Immunoglobulin E), Serum: 212 kU/L — ABNORMAL HIGH (ref <=114)
Johnson Grass: <0.1 kU/L
Pecan/Hickory Tree IgE: 0.22 kU/L — ABNORMAL HIGH
Sheep Sorrel IgE: <0.1 kU/L
Timothy Grass: <0.1 kU/L

## 2018-05-18 LAB — INTERPRETATION:

## 2018-05-18 MED ORDER — PANTOPRAZOLE SODIUM 40 MG PO TBEC: 40.0000 mg | DELAYED_RELEASE_TABLET | Freq: Every day | ORAL | 2 refills | Status: AC

## 2018-05-18 NOTE — Telephone Encounter (Signed)
-----   Message from Nyoka Cowden, MD sent at 05/16/2018  6:16 AM EDT ----- After chart review rec Pantoprazole (protonix) 40 mg   Take  30-60 min before first meal of the day and Pepcid (famotidine)  20 mg one @  bedtime until return to office (whether having obvious hb or not) - this is the best way to tell whether stomach acid is contributing to your problem.

## 2018-05-18 NOTE — Telephone Encounter (Signed)
Spoke with the pt and notified of recs per MW  She verbalized understanding  She states already has the pepcid  She is aware to take this at bedtime I have sent rx for protonix  Nothing further needed

## 2018-05-18 NOTE — Progress Notes (Signed)
LMTCB

## 2018-05-19 ENCOUNTER — Telehealth: Payer: Self-pay | Admitting: Internal Medicine

## 2018-05-19 NOTE — Telephone Encounter (Signed)
Kerri Cowden, MD  Christen Butter, CMA        Call patient : Studies are c/w allergies to ragweed/ trees > it's late in the season now and no issues for this year > Be sure patient has/keeps f/u ov so we can go over all the details of this study and get a plan together moving forward - ok to move up f/u if not feeling better and wants to be seen sooner    Pt is aware of results and voiced her understanding. Pt has pending OV for 06/26/18, she does not wish to move appt up, as she is feeling better. Nothing further is needed.

## 2018-06-23 ENCOUNTER — Other Ambulatory Visit: Payer: Self-pay | Admitting: Cardiovascular Disease

## 2018-06-26 ENCOUNTER — Ambulatory Visit (INDEPENDENT_AMBULATORY_CARE_PROVIDER_SITE_OTHER): Payer: Medicare Other | Admitting: Internal Medicine

## 2018-06-26 ENCOUNTER — Encounter: Payer: Self-pay | Admitting: Internal Medicine

## 2018-06-26 VITALS — BP 116/64 | HR 60 | Ht 64.0 in | Wt 162.2 lb

## 2018-06-26 DIAGNOSIS — R05 Cough: Secondary | ICD-10-CM

## 2018-06-26 DIAGNOSIS — I10 Essential (primary) hypertension: Secondary | ICD-10-CM | POA: Diagnosis not present

## 2018-06-26 DIAGNOSIS — R0609 Other forms of dyspnea: Secondary | ICD-10-CM

## 2018-06-26 DIAGNOSIS — I2510 Atherosclerotic heart disease of native coronary artery without angina pectoris: Secondary | ICD-10-CM | POA: Diagnosis not present

## 2018-06-26 DIAGNOSIS — J4550 Severe persistent asthma, uncomplicated: Secondary | ICD-10-CM

## 2018-06-26 DIAGNOSIS — R058 Other specified cough: Secondary | ICD-10-CM

## 2018-06-26 LAB — PULMONARY FUNCTION TEST
DL/VA % PRED: 112 %
DL/VA: 5.4 ml/min/mmHg/L
DLCO UNC: 14.31 ml/min/mmHg
DLCO unc % pred: 58 %
FEF 25-75 POST: 0.55 L/s
FEF 25-75 PRE: 0.46 L/s
FEF2575-%Change-Post: 19 %
FEF2575-%PRED-PRE: 40 %
FEF2575-%Pred-Post: 48 %
FEV1-%Change-Post: 7 %
FEV1-%PRED-PRE: 48 %
FEV1-%Pred-Post: 52 %
FEV1-Post: 0.92 L
FEV1-Pre: 0.86 L
FEV1FVC-%Change-Post: 4 %
FEV1FVC-%PRED-PRE: 83 %
FEV6-%Change-Post: 3 %
FEV6-%PRED-POST: 65 %
FEV6-%Pred-Pre: 63 %
FEV6-POST: 1.46 L
FEV6-Pre: 1.41 L
FEV6FVC-%CHANGE-POST: 0 %
FEV6FVC-%PRED-POST: 106 %
FEV6FVC-%Pred-Pre: 106 %
FVC-%Change-Post: 3 %
FVC-%Pred-Post: 61 %
FVC-%Pred-Pre: 59 %
FVC-Post: 1.46 L
FVC-Pre: 1.42 L
PRE FEV1/FVC RATIO: 61 %
Post FEV1/FVC ratio: 63 %
Post FEV6/FVC ratio: 100 %
Pre FEV6/FVC Ratio: 100 %
RV % pred: 113 %
RV: 2.83 L
TLC % PRED: 84 %
TLC: 4.29 L

## 2018-06-26 MED ORDER — PREDNISONE 10 MG PO TABS: ORAL_TABLET | ORAL | 0 refills | Status: DC

## 2018-06-26 MED ORDER — BISOPROLOL FUMARATE 5 MG PO TABS: 5.0000 mg | ORAL_TABLET | Freq: Every day | ORAL | 11 refills | Status: DC

## 2018-06-26 MED ORDER — BUDESONIDE-FORMOTEROL FUMARATE 80-4.5 MCG/ACT IN AERO: 2.0000 | INHALATION_SPRAY | Freq: Two times a day (BID) | RESPIRATORY_TRACT | 0 refills | Status: DC

## 2018-06-26 MED ORDER — BUDESONIDE-FORMOTEROL FUMARATE 80-4.5 MCG/ACT IN AERO: 2.0000 | INHALATION_SPRAY | Freq: Two times a day (BID) | RESPIRATORY_TRACT | 11 refills | Status: DC

## 2018-06-26 NOTE — Progress Notes (Signed)
Kerri Sosa, female    DOB: 02-19-1931,    MRN: [id number]    Brief patient profile:  16 yowf never smoker with h/o IHD s/p MI around 2014 and breathing was back to nl until fall 2018 with indolent  onset of doe she assoc c onset of watery rhinitis/pnds  and subjective wheeze/ am cough rx clariton /advair helped some but not back to baseline so referred to pulmonary clinic 05/15/2018 by Dr   Mady Gemma with last note 05/06/18 stating cards felt she was stable though does have h/o mild AS    History of Present Illness  05/15/2018  1st office eval/ Kerri Sosa   Chief Complaint  Patient presents with  . Pulmonary Consult    Referred by Dr. Arlyce Dice. Pt c/o SOB x 6 months. She gets winded taking a shower and getting dressed.   Dyspnea:   MMRC3 = can't walk 100 yards even at a slow pace at a flat grade s stopping due to sob  This problem  No better on advair Cough: better drainage since clariton /advair   Day >> noct assoc overt HB Sleep: ok one pillow  rec Try zyrtec 10 mg one at bedtime over the counter - if not helping the dripping go back to clariton  Please schedule a follow up office visit in 4 weeks, sooner if needed  with all medications /inhalers/ solutions in hand so we can verify exactly what you are taking. This includes all medications from all doctors and over the counters - PFT's on return but DON'T use your advair that day - add:  Pantoprazole (protonix) 40 mg   Take  30-60 min before first meal of the day and Pepcid (famotidine)  20 mg one @  bedtime until return to office - this is the best way to tell whether stomach acid is contributing to your problem.      06/26/2018  f/u ov/Kerri Sosa re: probable asthma maint on advair 100 bid  Chief Complaint  Patient presents with  . Shortness of Breath    Reports that her breathing is unchanged since her last OV. Still having issues with DOE and chest tightness. Denies coughing or wheezing.  Dyspnea:  MMRC3 = can't walk 100 yards even at a  slow pace at a flat grade s stopping due to sob   Cough: about the same/ sporadic day > noct Sleeping: better on zyrtec no longer sneezing or much pnds Kerri use: none 02: none    No obvious day to day or daytime variability or assoc excess/ purulent sputum or mucus plugs or hemoptysis or cp   subjective wheeze or overt sinus or hb symptoms.   Sleeping better as above flat  without nocturnal  or early am exacerbation  of respiratory  c/o's or need for noct Kerri. Also denies any obvious fluctuation of symptoms with weather or environmental changes or other aggravating or alleviating factors except as outlined above   No unusual exposure hx or h/o childhood pna/ asthma or knowledge of premature birth.  Current Allergies, Complete Past Medical History, Past Surgical History, Family History, and Social History were reviewed in [employer] [facility] record.  ROS  The following are not active complaints unless bolded Hoarseness, sore throat, dysphagia, dental problems, itching, sneezing,  nasal congestion or discharge of excess mucus or purulent secretions, ear ache,   fever, chills, sweats, unintended wt loss or wt gain, classically pleuritic or exertional cp,  orthopnea pnd or arm/hand swelling  or  leg swelling, presyncope, palpitations, abdominal pain, anorexia, nausea, vomiting, diarrhea  or change in bowel habits or change in bladder habits, change in stools or change in urine, dysuria, hematuria,  rash, arthralgias, visual complaints, headache, numbness, weakness or ataxia or problems with walking or coordination,  change in mood or  memory.        Current Meds  Medication Sig  . amLODipine-valsartan (EXFORGE) 5-160 MG tablet Take 1 tablet by mouth daily.  Marland Kitchen aspirin EC 81 MG tablet Take 81 mg by mouth daily.  . clopidogrel (PLAVIX) 75 MG tablet Take 1 tablet (75 mg total) by mouth daily.  . hydrochlorothiazide (HYDRODIURIL) 25 MG tablet TAKE ONE TABLET BY MOUTH EVERY DAY  .  naproxen sodium (ALEVE) 220 MG tablet Take 220 mg by mouth daily.  . nitroGLYCERIN (NITROSTAT) 0.4 MG SL tablet Place 1 tablet (0.4 mg total) under the tongue every 5 (five) minutes as needed for chest pain. Max three doses.  . pantoprazole (PROTONIX) 40 MG tablet Take 1 tablet (40 mg total) by mouth daily before breakfast.  . potassium chloride SA (K-DUR,KLOR-CON) 20 MEQ tablet Take 1 tablet (20 mEq total) by mouth daily.  . pravastatin (PRAVACHOL) 40 MG tablet Take 1 tablet (40 mg total) by mouth daily.  . [  ADVAIR DISKUS 100-50 MCG/DOSE AEPB Inhale 1 puff into the lungs 2 (two) times daily.  . [ ]  carvedilol (COREG) 12.5 MG tablet TAKE ONE TABLET BY MOUTH TWICE DAILY  . [  loratadine (CLARITIN) 10 MG tablet Take 10 mg by mouth daily.            Objective:    amb wf nad  Wt Readings from Last 3 Encounters:  06/26/18 162 lb 3.2 oz (73.6 kg)  05/15/18 160 lb 9.6 oz (72.8 kg)  03/18/18 160 lb (72.6 kg)     Vital signs reviewed - Note on arrival 02 sats  96% on RA        HEENT: Top dentures/ nl  turbinates bilaterally, and oropharynx. Nl external ear canals without cough reflex   NECK :  without JVD/Nodes/TM/ nl carotid upstrokes bilaterally   LUNGS: no acc muscle use, Kyphotic contour chest with distant bs  bilaterally without cough on insp or exp maneuvers   CV: RRR II-III/VI sem   no s3 or murmur or increase in P2, and no edema   ABD:  soft and nontender with nl inspiratory excursion in the supine position. No bruits or organomegaly appreciated, bowel sounds nl  MS:  Nl gait/ ext warm without deformities, calf tenderness, cyanosis or clubbing No obvious joint restrictions   SKIN: warm and dry without lesions    NEURO:  alert, approp, nl sensorium with  no motor or cerebellar deficits apparent.      I personally reviewed images and agree with radiology impression as follows:  CXR:    Negative for acute cardiopulmonary disease. Hiatal hernia. My impression:   Significant mass effect by very large HH - grapefruit size     Assessment

## 2018-06-26 NOTE — Progress Notes (Signed)
PFT completed today.  

## 2018-06-26 NOTE — Patient Instructions (Addendum)
Stop corevidol and start bisoprolol 5 mg one daily   Stop advair permanently   Symbicot 80 Take 2 puffs first thing in am and then another 2 puffs about 12 hours later.( fill prescription if helps your asthma)    Work on inhaler technique:  relax and gently blow all the way out then take a nice smooth deep breath back in, triggering the inhaler at same time you start breathing in.  Hold for up to 5 seconds if you can. Blow out thru nose. Rinse and gargle with water when done    Prednisone 10 mg take  4 each am x 2 days,   2 each am x 2 days,  1 each am x 2 days and stop   Please schedule a follow up office visit in 4 weeks, sooner if needed  with all medications /inhalers/ solutions in hand so we can verify exactly what you are taking. This includes all medications from all doctors and over the counters

## 2018-06-27 ENCOUNTER — Encounter: Payer: Self-pay | Admitting: Internal Medicine

## 2018-06-27 DIAGNOSIS — J4550 Severe persistent asthma, uncomplicated: Secondary | ICD-10-CM | POA: Insufficient documentation

## 2018-06-27 NOTE — Assessment & Plan Note (Signed)
Echo 06/05/2015 - Normal LV size with mild LV hypertrophy. EF 60-65%. Normal RV   size and systolic function. Moderate LAE. Mild aortic stenosis. - 05/15/2018  Walked RA x 3 laps @ 185 ft each stopped due to  End of study, nl pace, no  desat - min sob     - PFT's 06/26/2018  With restrictive changes and mild airflow > see asthma    She has a very large HH and kyphosis that explain there restrictive problem but unfortunately neither are treatable so will focus on asthma max rx as it may be reversible.

## 2018-06-27 NOTE — Assessment & Plan Note (Addendum)
In the setting of respiratory symptoms of unknown etiology,  It would be preferable to use bystolic, the most beta -1  selective Beta blocker available in sample form, with bisoprolol the most selective generic choice  on the market, at least on a trial basis, to make sure the spillover Beta 2 effects of the less specific Beta blockers are not contributing to this patient's symptoms.    >>>  Try bisoprolol 5 mg daily in place of coreg in view of asthma dx   I had an extended discussion with the patient reviewing all relevant studies completed to date and  lasting 15 to 20 minutes of a 25 minute visit    Each maintenance medication was reviewed in detail including most importantly the difference between maintenance and prns and under what circumstances the prns are to be triggered using an action plan format that is not reflected in the computer generated alphabetically organized AVS.     Please see AVS for specific instructions unique to this visit that I personally wrote and verbalized to the the pt in detail and then reviewed with pt  by my nurse highlighting any  changes in therapy recommended at today's visit to their plan of care.

## 2018-06-27 NOTE — Assessment & Plan Note (Signed)
PFT's  06/26/2018  FEV1 0.92 (52 % ) ratio 63  p 7 % improvement from saba p nothing prior to study with DLCO  58 % corrects to 112 % for alv volume   - 06/26/2018  After extensive coaching inhaler device,  effectiveness =    75% from a baseline of 50% >  Try symb 80 2bid   Although she meets the criteria for severe asthma, she actually mostly has restrictive changes so no need for high dose steroids here but I do rec she use the most specific BB on market in this setting (see hbp) and I did give her a short course of prednisone just to see to what extent any of her problems reverse with prednisone.

## 2018-06-27 NOTE — Assessment & Plan Note (Addendum)
Add gerd rx 05/15/2018  - Allergy profile 05/15/2018 >  Eos 0.3 /  IgE  212  RAST pos ragweed/ trees  - 05/15/18 added zyrtec > improved 06/26/2018   >>> there may be a component of asthma or advair related uacs so rx with low dose symbicort/ stop advair for now and continue zyrtec

## 2018-07-27 ENCOUNTER — Encounter: Payer: Self-pay | Admitting: Internal Medicine

## 2018-07-27 ENCOUNTER — Ambulatory Visit (INDEPENDENT_AMBULATORY_CARE_PROVIDER_SITE_OTHER): Payer: Medicare Other | Admitting: Internal Medicine

## 2018-07-27 VITALS — BP 128/80 | HR 53 | Ht 64.0 in | Wt 161.8 lb

## 2018-07-27 DIAGNOSIS — J4550 Severe persistent asthma, uncomplicated: Secondary | ICD-10-CM

## 2018-07-27 DIAGNOSIS — I10 Essential (primary) hypertension: Secondary | ICD-10-CM

## 2018-07-27 DIAGNOSIS — R05 Cough: Secondary | ICD-10-CM

## 2018-07-27 DIAGNOSIS — R058 Other specified cough: Secondary | ICD-10-CM

## 2018-07-27 NOTE — Progress Notes (Signed)
Kerri Sosa, female    DOB: [birth year]     MRN: [id number]    Brief patient profile:  [id number] yowf never smoker with h/o IHD s/p MI around 2014 and breathing was back to nl until fall 2018 with indolent  onset of doe she assoc c onset of watery rhinitis/pnds  and subjective wheeze/ am cough rx clariton /advair helped some but not back to baseline so referred to pulmonary clinic 05/15/2018 by Dr   Kerri Sosa with last note 05/06/18 stating cards felt she was stable though does have h/o mild AS    History of Present Illness  05/15/2018  1st office eval/ Kerri Sosa   Chief Complaint  Patient presents with  . Pulmonary Consult    Referred by Kerri Sosa. Pt c/o SOB x 6 months. She gets winded taking a shower and getting dressed.   Dyspnea:   MMRC3 = can't walk 100 yards even at a slow pace at a flat grade s stopping due to sob  This problem  No better on advair Cough: better drainage since clariton /advair   Day >> noct assoc overt HB Sleep: ok one pillow  rec Try zyrtec 10 mg one at bedtime over the counter - if not helping the dripping go back to clariton  Please schedule a follow up office visit in 4 weeks, sooner if needed  with all medications /inhalers/ solutions in hand so we can verify exactly what you are taking. This includes all medications from all doctors and over the counters - PFT's on return but DON'T use your advair that day - add:  Pantoprazole (protonix) 40 mg   Take  30-60 min before first meal of the day and Pepcid (famotidine)  20 mg one @  bedtime until return to office - this is the best way to tell whether stomach acid is contributing to your problem.      06/26/2018  f/u ov/Kerri Sosa re: probable asthma maint on advair 100 bid  Chief Complaint  Patient presents with  . Shortness of Breath    Reports that her breathing is unchanged since her last OV. Still having issues with DOE and chest tightness. Denies coughing or wheezing.  Dyspnea:  MMRC3 = can't walk 100 yards even at a  slow pace at a flat grade s stopping due to sob   Cough: about the same/ sporadic day > noct Sleeping: better on zyrtec no longer sneezing or much pnds SABA use: none 02: none   rec Stop corevidol and start bisoprolol 5 mg one daily  Stop advair permanently  Symbicot 80 Take 2 puffs first thing in am and then another 2 puffs about 12 hours later.( fill prescription if helps your asthma)  Work on inhaler technique:    Prednisone 10 mg take  4 each am x 2 days,   2 each am x 2 days,  1 each am x 2 days and stop  Please schedule a follow up office visit in 4 weeks, sooner if needed  with all medications /inhalers/ solutions in hand so we can verify exactly what you are taking. This includes all medications from all doctors and over the counters    07/27/2018  f/u ov/Kerri Sosa re: uacs/ no flare of cough back on carvedilol / no need for any inhalers  Chief Complaint  Patient presents with  . Follow-up    Breathing is much improved since the last visit. No new co's.   Dyspnea:  Not limited by breathing from  desired activities   Cough: min sporadic / never noct/dry never noct Sleeping: flat/ one pillow SABA use: none 02: none    No obvious day to day or daytime variability or assoc excess/ purulent sputum or mucus plugs or hemoptysis or cp or chest tightness, subjective wheeze or overt sinus or hb symptoms.   Sleeping as above without nocturnal  or early am exacerbation  of respiratory  c/o's or need for noct saba. Also denies any obvious fluctuation of symptoms with weather or environmental changes or other aggravating or alleviating factors except as outlined above   No unusual exposure hx or h/o childhood pna/ asthma or knowledge of premature birth.  Current Allergies, Complete Past Medical History, Past Surgical History, Family History, and Social History were reviewed in Kerri Sosa record.  ROS  The following are not active complaints unless bolded Hoarseness,  sore throat, dysphagia, dental problems, itching, sneezing,  nasal congestion or discharge of excess mucus or purulent secretions, ear ache,   fever, chills, sweats, unintended wt loss or wt gain, classically pleuritic or exertional cp,  orthopnea pnd or arm/hand swelling  or leg swelling, presyncope, palpitations, abdominal pain, anorexia, nausea, vomiting, diarrhea  or change in bowel habits or change in bladder habits, change in stools or change in urine, dysuria, hematuria,  rash, arthralgias, visual complaints, headache, numbness, weakness or ataxia or problems with walking or coordination,  change in mood or  memory.        Current Meds  Medication Sig  . amLODipine-valsartan (EXFORGE) 5-160 MG tablet Take 1 tablet by mouth daily.  Marland Kitchen aspirin EC 81 MG tablet Take 81 mg by mouth daily.  . bisoprolol (ZEBETA) 5 MG tablet Take 1 tablet (5 mg total) by mouth daily.  .    . cetirizine (ZYRTEC) 10 MG tablet Take 10 mg by mouth daily.  . clopidogrel (PLAVIX) 75 MG tablet Take 1 tablet (75 mg total) by mouth daily.  . famotidine (PEPCID) 20 MG tablet Take 20 mg by mouth at bedtime.  . hydrochlorothiazide (HYDRODIURIL) 25 MG tablet TAKE ONE TABLET BY MOUTH EVERY DAY  . ibuprofen (ADVIL,MOTRIN) 200 MG tablet Take 200 mg by mouth every 6 (six) hours as needed.  . loratadine (CLARITIN) 10 MG tablet Take 10 mg by mouth daily.  . nitroGLYCERIN (NITROSTAT) 0.4 MG SL tablet Place 1 tablet (0.4 mg total) under the tongue every 5 (five) minutes as needed for chest pain. Max three doses.  . pantoprazole (PROTONIX) 40 MG tablet Take 1 tablet (40 mg total) by mouth daily before breakfast.  . potassium chloride SA (K-DUR,KLOR-CON) 20 MEQ tablet Take 1 tablet (20 mEq total) by mouth daily.  . pravastatin (PRAVACHOL) 40 MG tablet Take 1 tablet (40 mg total) by mouth daily.                Objective:     amb wf nad   07/27/2018        161  06/26/18 162 lb 3.2 oz (73.6 kg)  05/15/18 160 lb 9.6 oz (72.8 kg)   03/18/18 160 lb (72.6 kg)     Vital signs reviewed - Note on arrival 02 sats  95% on RA     HEENT: Top dentures/ nl  turbinates bilaterally, and oropharynx. Nl external ear canals without cough reflex   NECK :  without JVD/Nodes/TM/ nl carotid upstrokes bilaterally   LUNGS: no acc muscle use,  Kyphotic  contour chest which is clear to A and P bilaterally  without cough on insp or exp maneuvers   CV:  RRR  no s3 -  II-III/VI sem / no  increase in P2, and no edema   ABD:  soft and nontender with nl inspiratory excursion in the supine position. No bruits or organomegaly appreciated, bowel sounds nl  MS:  Nl gait/ ext warm without deformities, calf tenderness, cyanosis or clubbing No obvious joint restrictions   SKIN: warm and dry without lesions    NEURO:  alert, approp, nl sensorium with  no motor or cerebellar deficits apparent.         I personally reviewed images and agree with radiology impression as follows:  CXR:    Negative for acute cardiopulmonary disease. Hiatal hernia. My impression:  Significant mass effect by very large HH - grapefruit size     Assessment

## 2018-07-27 NOTE — Patient Instructions (Signed)
When you finish your pantoprazole, start starting pepcid  20 mg after bfast and supper until you see your PCP    If you are satisfied with your treatment plan,  let your doctor know and he/she can either refill your medications or you can return here when your prescription runs out.     If in any way you are not 100% satisfied,  please tell us.  If 100% better, tell your friends!  Pulmonary follow up is as needed

## 2018-07-31 ENCOUNTER — Encounter: Payer: Self-pay | Admitting: Internal Medicine

## 2018-07-31 NOTE — Assessment & Plan Note (Addendum)
Onset Fall 2018 Add gerd rx 05/15/2018  - Allergy profile 05/15/2018 >  Eos 0.3 /  IgE  212  RAST pos ragweed/ trees  - 05/15/18 added zyrtec > improved 06/26/2018    I had an extended final summary discussion with the patient reviewing all relevant studies completed to date and  lasting 15 to 20 minutes of a 25 minute visit on the following issues:   1) not clear whether she responded to stopping advair, taking symbicort transiently "in the ragweed season" which is now over, or whether her symptoms were all upper airway in the first place but no evidence of any flare off symbicort and on carvedilol which favors uacs over asthma  2) Discussed the recent press about ppi's in the context of a statistically significant (but questionably clinically relevant) increase in CRI in pts on ppi vs h2's > bottom line is the lowest dose of ppi that controls   gerd is the right dose and if that dose is zero that's fine esp since h2's are cheaper > see taper outlined in avs   3)  Each maintenance medication was reviewed in detail including most importantly the difference between maintenance and as needed and under what circumstances the prns are to be used.  Please see AVS for specific  Instructions which are unique to this visit and I personally typed out  which were reviewed in detail in writing with the patient and a copy provided.     4) pulmonary f/u can be prn

## 2018-07-31 NOTE — Assessment & Plan Note (Addendum)
Changed coreg to bisoprolol 06/26/2018 due to ? Asthma.  Back on coreg now s flare so favor uacs over asthma causing her prior symptoms and ok to continue coreg

## 2018-07-31 NOTE — Assessment & Plan Note (Signed)
PFT's  06/26/2018  FEV1 0.92 (52 % ) ratio 63  p 7 % improvement from saba p nothing prior to study with DLCO  58 % corrects to 112 % for alv volume   - 06/26/2018  After extensive coaching inhaler device,  effectiveness =    75% from a baseline of 50% >  Try symb 80 2bid > did not maintain on it s flare as of 07/27/2018   Many pt with chronic asthma have more of a fixed pattern at her age resembling copd but she is not active enough to be limited by this degree of airflow obst.  Clearly was worse on advair but that may be due to uacs which has been in my experience a good way to distinguish the two:  advair makes uacs much worse so would avoid it in the future in favor of restarting symbicort 80 2bid if she again worsens assuming already on aggressive gerd rx which may have made more difference than anything else, also a common finding in uacs....  Pulmonary f/u is prn

## 2018-09-23 ENCOUNTER — Other Ambulatory Visit: Payer: Self-pay | Admitting: Internal Medicine

## 2018-09-30 ENCOUNTER — Telehealth: Payer: Self-pay | Admitting: Nurse Practitioner

## 2018-09-30 NOTE — Telephone Encounter (Signed)
Left message for patient to call back regarding appointment with Dr. Nahser on 3/20. Call placed due to restrictions enacted for Covid 19.   

## 2018-10-02 ENCOUNTER — Ambulatory Visit: Payer: Medicare Other | Admitting: Cardiovascular Disease

## 2018-10-21 ENCOUNTER — Other Ambulatory Visit: Payer: Self-pay

## 2018-10-21 MED ORDER — CLOPIDOGREL BISULFATE 75 MG PO TABS: 75.0000 mg | ORAL_TABLET | Freq: Every day | ORAL | 2 refills | Status: DC

## 2018-10-23 ENCOUNTER — Telehealth: Payer: Self-pay | Admitting: Physician Assistant

## 2018-10-23 NOTE — Telephone Encounter (Signed)
Spoke with patient who confirmed all demographics and e-mail.  I activated her My Chart.  She has an I-pad and a smart phone but does not know how to get the internet or play store, so I was unable to coach her through My Chart or Webex.  She said that her daughter will be with her on the visit with Bhagat on 10/29/2018 and that she will help.

## 2018-10-27 ENCOUNTER — Telehealth: Payer: Self-pay | Admitting: Physician Assistant

## 2018-10-27 NOTE — Telephone Encounter (Signed)
New Message   Patient needs help setting up for virtual appointment on my chart.

## 2018-10-27 NOTE — Telephone Encounter (Signed)
Virtual Visit Pre-Appointment Phone Call  Steps For Call:  1. Confirm consent - "In the setting of the current Covid19 crisis, you are scheduled for a (phone or video) visit with your provider on (date) at (time).  Just as we do with many in-office visits, in order for you to participate in this visit, we must obtain consent.  If you'd like, I can send this to your mychart (if signed up) or email for you to review.  Otherwise, I can obtain your verbal consent now.  All virtual visits are billed to your insurance company just like a normal visit would be.  By agreeing to a virtual visit, we'd like you to understand that the technology does not allow for your provider to perform an examination, and thus may limit your provider's ability to fully assess your condition.  Finally, though the technology is pretty good, we cannot assure that it will always work on either your or our end, and in the setting of a video visit, we may have to convert it to a phone-only visit.  In either situation, we cannot ensure that we have a secure connection.  Are you willing to proceed?"  2. Give patient instructions for WebEx download to smartphone as below if video visit  3. Advise patient to be prepared with any vital sign or heart rhythm information, their current medicines, and a piece of paper and pen handy for any instructions they may receive the day of their visit  4. Inform patient they will receive a phone call 15 minutes prior to their appointment time (may be from unknown caller ID) so they should be prepared to answer  5. Confirm that appointment type is correct in Epic appointment notes (video vs telephone)    TELEPHONE CALL NOTE  Kerri Sosa has been deemed a candidate for a follow-up tele-health visit to limit community exposure during the Covid-19 pandemic. I spoke with the patient via phone to ensure availability of phone/video source, confirm preferred email & phone number, and discuss  instructions and expectations.  I reminded Kerri Sosa to be prepared with any vital sign and/or heart rhythm information that could potentially be obtained via home monitoring, at the time of her visit. I reminded Kerri Sosa to expect a phone call at the time of her visit if her visit.  Did the patient verbally acknowledge consent to treatment? 10/29/2018  Elliot Cousin, RMA 10/27/2018 8:26 AM   DOWNLOADING THE WEBEX SOFTWARE TO SMARTPHONE  - If Apple, go to [employer] and type in WebEx in the search bar. Download [employer], the blue/green circle. The app is free but as with any other app downloads, their phone may require them to verify saved payment information or Apple password. The patient does NOT have to create an account.  - If Android, ask patient to go to [employer] and type in WebEx in the search bar. Download [employer], the blue/green circle. The app is free but as with any other app downloads, their phone may require them to verify saved payment information or Android password. The patient does NOT have to create an account.   CONSENT FOR TELE-HEALTH VISIT - PLEASE REVIEW  I hereby voluntarily request, consent and authorize [facility] and its employed or contracted physicians, physician assistants, nurse practitioners or other licensed health care professionals (the Practitioner), to provide me with telemedicine health care services (the "Services") as deemed necessary by the treating Practitioner. I  acknowledge and consent to receive the Services by the Practitioner via telemedicine. I understand that the telemedicine visit will involve communicating with the Practitioner through live audiovisual communication technology and the disclosure of certain medical information by electronic transmission. I acknowledge that I have been given the opportunity to request an in-person assessment or other available alternative prior to the telemedicine visit and am  voluntarily participating in the telemedicine visit.  I understand that I have the right to withhold or withdraw my consent to the use of telemedicine in the course of my care at any time, without affecting my right to future care or treatment, and that the Practitioner or I may terminate the telemedicine visit at any time. I understand that I have the right to inspect all information obtained and/or recorded in the course of the telemedicine visit and may receive copies of available information for a reasonable fee.  I understand that some of the potential risks of receiving the Services via telemedicine include:  Marland Kitchen Delay or interruption in medical evaluation due to technological equipment failure or disruption; . Information transmitted may not be sufficient (e.g. poor resolution of images) to allow for appropriate medical decision making by the Practitioner; and/or  . In rare instances, security protocols could fail, causing a breach of personal health information.  Furthermore, I acknowledge that it is my responsibility to provide information about my medical history, conditions and care that is complete and accurate to the best of my ability. I acknowledge that Practitioner's advice, recommendations, and/or decision may be based on factors not within their control, such as incomplete or inaccurate data provided by me or distortions of diagnostic images or specimens that may result from electronic transmissions. I understand that the practice of medicine is not an exact science and that Practitioner makes no warranties or guarantees regarding treatment outcomes. I acknowledge that I will receive a copy of this consent concurrently upon execution via email to the email address I last provided but may also request a printed copy by calling the office of [place].    I understand that my insurance will be billed for this visit.   I have read or had this consent read to me. . I understand the  contents of this consent, which adequately explains the benefits and risks of the Services being provided via telemedicine.  . I have been provided ample opportunity to ask questions regarding this consent and the Services and have had my questions answered to my satisfaction. . I give my informed consent for the services to be provided through the use of telemedicine in my medical care  By participating in this telemedicine visit I agree to the above.

## 2018-10-29 ENCOUNTER — Telehealth (INDEPENDENT_AMBULATORY_CARE_PROVIDER_SITE_OTHER): Payer: Medicare Other | Admitting: Physician Assistant

## 2018-10-29 ENCOUNTER — Other Ambulatory Visit: Payer: Self-pay

## 2018-10-29 ENCOUNTER — Encounter: Payer: Self-pay | Admitting: Physician Assistant

## 2018-10-29 VITALS — BP 134/74 | HR 54 | Ht 64.0 in | Wt 160.0 lb

## 2018-10-29 DIAGNOSIS — Z7189 Other specified counseling: Secondary | ICD-10-CM

## 2018-10-29 DIAGNOSIS — I350 Nonrheumatic aortic (valve) stenosis: Secondary | ICD-10-CM

## 2018-10-29 DIAGNOSIS — I2510 Atherosclerotic heart disease of native coronary artery without angina pectoris: Secondary | ICD-10-CM

## 2018-10-29 DIAGNOSIS — E782 Mixed hyperlipidemia: Secondary | ICD-10-CM

## 2018-10-29 DIAGNOSIS — I10 Essential (primary) hypertension: Secondary | ICD-10-CM

## 2018-10-29 NOTE — Progress Notes (Signed)
Virtual Visit via Telephone Note   This visit type was conducted due to national recommendations for restrictions regarding the COVID-19 Pandemic (e.g. social distancing) in an effort to limit this patient's exposure and mitigate transmission in our community.  Due to her co-morbid illnesses, this patient is at least at moderate risk for complications without adequate follow up.  This format is felt to be most appropriate for this patient at this time.  The patient did not have access to video technology/had technical difficulties with video requiring transitioning to audio format only (telephone).  All issues noted in this document were discussed and addressed.  No physical exam could be performed with this format.  Please refer to the patient's chart for her  consent to telehealth for [facility].   Evaluation Performed:  Follow-up visit  Date:  10/29/2018   ID:  Kerri Sosa, DOB 13-Aug-1930, MRN [id number]  Patient Location: Home Provider Location: Home  PCP:  Richmond CampbellKaplan, Kristen W., PA-C  Cardiologist:  Kristeen MissPhilip Nahser, MD  Chief Complaint:  6 months follow up  History of Present Illness:    Kerri Sosa is a 85 y.o. female with hx of CAD s/p BMS to mRCA (02/2013 @ [place], [place]), MR, AS and HTN seen for follow up.   The patient does not have symptoms concerning for COVID-19 infection (fever, chills, cough, or new shortness of breath).   Since last office visit with Dr. Elease HashimotoNahser 03/2018, patient was seen by pulmonologist Dr. Sherene SiresWert and diagnosed with asthma.  She was placed on inhaler and since then her dyspnea on exertion has been improved, almost resolved.  She is compliant with her medication.  She denies chest pain, palpitation, orthopnea, PND, syncope, lower extremity edema or melena.  She is active taking care of household chores.   Past Medical History:  Diagnosis Date  . Asthma   . Crohn's disease (HCC)   . Heart attack (HCC)   . Hiatal hernia   . Hypertension    Past Surgical  History:  Procedure Laterality Date  . APPENDECTOMY    . HERNIA REPAIR       Current Meds  Medication Sig  . amLODipine-valsartan (EXFORGE) 5-160 MG tablet Take 1 tablet by mouth daily.  Marland Kitchen. aspirin EC 81 MG tablet Take 81 mg by mouth daily.  . bisoprolol (ZEBETA) 5 MG tablet Take 1 tablet (5 mg total) by mouth daily.  . carvedilol (COREG) 12.5 MG tablet Take 12.5 mg by mouth 2 (two) times daily with a meal.  . cetirizine (ZYRTEC) 10 MG tablet Take 10 mg by mouth daily.  . clopidogrel (PLAVIX) 75 MG tablet Take 1 tablet (75 mg total) by mouth daily.  . famotidine (PEPCID) 20 MG tablet Take 20 mg by mouth at bedtime.  . Fluticasone-Salmeterol (ADVAIR DISKUS) 100-50 MCG/DOSE AEPB INHALE 1 PUFF INTO THE LUNGS 2 TIMES DAILY FOR 30 DAYS.  . hydrochlorothiazide (HYDRODIURIL) 25 MG tablet TAKE ONE TABLET BY MOUTH EVERY DAY  . ibuprofen (ADVIL,MOTRIN) 200 MG tablet Take 200 mg by mouth every 6 (six) hours as needed.  . loratadine (CLARITIN) 10 MG tablet Take 10 mg by mouth daily.  . nitroGLYCERIN (NITROSTAT) 0.4 MG SL tablet Place 1 tablet (0.4 mg total) under the tongue every 5 (five) minutes as needed for chest pain. Max three doses.  . pantoprazole (PROTONIX) 40 MG tablet Take 1 tablet (40 mg total) by mouth daily before breakfast.  . potassium chloride SA (K-DUR,KLOR-CON) 20 MEQ tablet Take 1 tablet (20  mEq total) by mouth daily.  . pravastatin (PRAVACHOL) 40 MG tablet Take 1 tablet (40 mg total) by mouth daily.     Allergies:   Lipitor [atorvastatin]   Social History   Tobacco Use  . Smoking status: Never Smoker  . Smokeless tobacco: Never Used  Substance Use Topics  . Alcohol use: No  . Drug use: No     Family Hx: The patient's family history includes Cancer in her father; Heart disease in her mother.  ROS:   Please see the history of present illness.    All other systems reviewed and are negative.   Prior CV studies:   The following studies were reviewed today:  Echo  05/2015 Study Conclusions  - Left ventricle: The cavity size was normal. Wall thickness was   increased in a pattern of mild LVH. Systolic function was normal.   The estimated ejection fraction was in the range of 60% to 65%.   Wall motion was normal; there were no regional wall motion   abnormalities. Doppler parameters are consistent with abnormal   left ventricular relaxation (grade 1 diastolic dysfunction). - Aortic valve: Probably trileaflet; moderately calcified leaflets.   There was mild stenosis. Mean gradient (S): 15 mm Hg. - Mitral valve: Mildly calcified annulus. There was trivial   regurgitation. - Left atrium: The atrium was moderately dilated. - Right ventricle: The cavity size was normal. Systolic function   was normal. - Tricuspid valve: Peak RV-RA gradient (S): 30 mm Hg. - Pulmonary arteries: PA peak pressure: 33 mm Hg (S). - Inferior vena cava: The vessel was normal in size. The   respirophasic diameter changes were in the normal range (>= 50%),   consistent with normal central venous pressure.  Impressions:  - Normal LV size with mild LV hypertrophy. EF 60-65%. Normal RV   size and systolic function. Moderate LAE. Mild aortic stenosis.  Labs/Other Tests and Data Reviewed:    EKG:  No ECG reviewed.  Recent Labs: 03/18/2018: ALT 9; BUN 26; Creatinine, Ser 1.02; Potassium 4.1; Sodium 144 05/15/2018: Hemoglobin 12.1; Platelets 257.0; Pro B Natriuretic peptide (BNP) 119.0; TSH 1.57   Recent Lipid Panel Lab Results  Component Value Date/Time   CHOL 180 03/18/2018 09:26 AM   TRIG 89 03/18/2018 09:26 AM   HDL 72 03/18/2018 09:26 AM   CHOLHDL 2.5 03/18/2018 09:26 AM   CHOLHDL 1.8 11/29/2015 08:42 AM   LDLCALC 90 03/18/2018 09:26 AM    Wt Readings from Last 3 Encounters:  10/29/18 160 lb (72.6 kg)  07/27/18 161 lb 12.8 oz (73.4 kg)  06/26/18 162 lb 3.2 oz (73.6 kg)     Objective:    Vital Signs:  BP 134/74   Pulse (!) 54   Ht 5\' 4"  (1.626 m)   Wt 160  lb (72.6 kg)   BMI 27.46 kg/m    Well nourished, well developed female in no acute distress. Normal affect.  Answer question appropriately.  ASSESSMENT & PLAN:    1. CAD -No angina.  Continue current therapy.  2. HTN -Blood pressure stable on current medication.  3. Aortic stenosis -No syncope.  Her dyspnea on exertion has been improved significantly since placed on inhaler for asthma.  COVID-19 Education: The signs and symptoms of COVID-19 were discussed with the patient and how to seek care for testing (follow up with PCP or arrange E-visit).  The importance of social distancing was discussed today.  Time:   Today, I have spent 5-10 minutes with the  patient with telehealth technology discussing the above problems.     Medication Adjustments/Labs and Tests Ordered: Current medicines are reviewed at length with the patient today.  Concerns regarding medicines are outlined above.   Tests Ordered: No orders of the defined types were placed in this encounter.   Medication Changes: No orders of the defined types were placed in this encounter.   Disposition:  Follow up in 1 year(s)  Signed, Manson Passey, PA  10/29/2018 10:13 AM    [facility]

## 2018-10-29 NOTE — Patient Instructions (Signed)
Medication Instructions:  none If you need a refill on your cardiac medications before your next appointment, please call your pharmacy.   Lab work: none If you have labs (blood work) drawn today and your tests are completely normal, you will receive your results only by: Marland Kitchen MyChart Message (if you have MyChart) OR . A paper copy in the mail If you have any lab test that is abnormal or we need to change your treatment, we will call you to review the results.  Testing/Procedures: none  Follow-Up: 1 YEAR with Dr Elease Hashimoto At [facility], you and your health needs are our priority.  As part of our continuing mission to provide you with exceptional heart care, we have created designated Provider Care Teams.  These Care Teams include your primary Cardiologist (physician) and Advanced Practice Providers (APPs -  Physician Assistants and Nurse Practitioners) who all work together to provide you with the care you need, when you need it.  Any Other Special Instructions Will Be Listed Below (If Applicable).

## 2019-03-21 ENCOUNTER — Other Ambulatory Visit: Payer: Self-pay | Admitting: Cardiovascular Disease

## 2019-04-22 ENCOUNTER — Other Ambulatory Visit: Payer: Self-pay | Admitting: Cardiovascular Disease

## 2019-07-07 ENCOUNTER — Other Ambulatory Visit: Payer: Self-pay

## 2019-07-07 ENCOUNTER — Telehealth: Payer: Self-pay | Admitting: Internal Medicine

## 2019-07-07 MED ORDER — BISOPROLOL FUMARATE 5 MG PO TABS: 5.0000 mg | ORAL_TABLET | Freq: Every day | ORAL | 11 refills | Status: AC

## 2019-07-07 NOTE — Telephone Encounter (Signed)
I called  and spoke with patient's daughter and made her aware that the pharmacy faxed the request yesterday and I received it today and was in the process of sending in the refill. She verbalized understanding. Nothing further is needed.

## 2019-07-19 ENCOUNTER — Other Ambulatory Visit: Payer: Self-pay | Admitting: Cardiovascular Disease

## 2019-08-02 ENCOUNTER — Other Ambulatory Visit: Payer: Self-pay | Admitting: Cardiovascular Disease

## 2019-08-02 MED ORDER — NITROGLYCERIN 0.4 MG SL SUBL: 0.4000 mg | SUBLINGUAL_TABLET | SUBLINGUAL | 2 refills | Status: DC | PRN

## 2019-08-25 ENCOUNTER — Telehealth: Payer: Self-pay | Admitting: Cardiovascular Disease

## 2019-08-25 ENCOUNTER — Other Ambulatory Visit: Payer: Self-pay | Admitting: Cardiovascular Disease

## 2019-08-25 MED ORDER — POTASSIUM CHLORIDE CRYS ER 20 MEQ PO TBCR: 20.0000 meq | EXTENDED_RELEASE_TABLET | Freq: Every day | ORAL | 0 refills | Status: DC

## 2019-08-25 MED ORDER — CLOPIDOGREL BISULFATE 75 MG PO TABS: 75.0000 mg | ORAL_TABLET | Freq: Every day | ORAL | 0 refills | Status: DC

## 2019-08-25 MED ORDER — AMLODIPINE BESYLATE-VALSARTAN 5-160 MG PO TABS: 1.0000 | ORAL_TABLET | Freq: Every day | ORAL | 0 refills | Status: DC

## 2019-08-25 NOTE — Telephone Encounter (Signed)
I spoke to [place] from [employer] and clarified NTG prescription.  She verbalized understanding.

## 2019-08-25 NOTE — Telephone Encounter (Signed)
Brayton Caves, with [facility], is calling to request instructions on how the patient should take her medication. Unable to make pharmacy aware that patient needs to schedule an appointment prior to medication refills because call ended. Please call to discuss.

## 2019-10-20 ENCOUNTER — Other Ambulatory Visit: Payer: Self-pay | Admitting: Cardiovascular Disease

## 2019-10-20 MED ORDER — POTASSIUM CHLORIDE CRYS ER 20 MEQ PO TBCR: 20.0000 meq | EXTENDED_RELEASE_TABLET | Freq: Every day | ORAL | 0 refills | Status: DC

## 2019-10-29 ENCOUNTER — Other Ambulatory Visit: Payer: Self-pay

## 2019-10-29 ENCOUNTER — Ambulatory Visit (INDEPENDENT_AMBULATORY_CARE_PROVIDER_SITE_OTHER): Payer: Medicare Other | Admitting: Cardiovascular Disease

## 2019-10-29 ENCOUNTER — Encounter: Payer: Self-pay | Admitting: Cardiovascular Disease

## 2019-10-29 VITALS — BP 148/88 | HR 52 | Ht 64.0 in | Wt 161.2 lb

## 2019-10-29 DIAGNOSIS — E782 Mixed hyperlipidemia: Secondary | ICD-10-CM | POA: Diagnosis not present

## 2019-10-29 DIAGNOSIS — I2510 Atherosclerotic heart disease of native coronary artery without angina pectoris: Secondary | ICD-10-CM

## 2019-10-29 DIAGNOSIS — Z79899 Other long term (current) drug therapy: Secondary | ICD-10-CM

## 2019-10-29 NOTE — Patient Instructions (Signed)
Medication Instructions:  Your physician recommends that you continue on your current medications as directed. Please refer to the Current Medication list given to you today.  *If you need a refill on your cardiac medications before your next appointment, please call your pharmacy*   Lab Work: Today: Lipid profile, Liver function and BMP If you have labs (blood work) drawn today and your tests are completely normal, you will receive your results only by: Marland Kitchen MyChart Message (if you have MyChart) OR . A paper copy in the mail If you have any lab test that is abnormal or we need to change your treatment, we will call you to review the results.   Testing/Procedures: None ordered   Follow-Up: At [facility], you and your health needs are our priority.  As part of our continuing mission to provide you with exceptional heart care, we have created designated Provider Care Teams.  These Care Teams include your primary Cardiologist (physician) and Advanced Practice Providers (APPs -  Physician Assistants and Nurse Practitioners) who all work together to provide you with the care you need, when you need it.  We recommend signing up for the patient portal called "MyChart".  Sign up information is provided on this After Visit Summary.  MyChart is used to connect with patients for Virtual Visits (Telemedicine).  Patients are able to view lab/test results, encounter notes, upcoming appointments, etc.  Non-urgent messages can be sent to your provider as well.   To learn more about what you can do with MyChart, go to [email or web address].    Your next appointment:   1 year(s)  The format for your next appointment:   In Person  Provider:   You may see Kristeen Miss, MD or one of the following Advanced Practice Providers on your designated Care Team:    Tereso Newcomer, PA-C  [place], [place]  Berton Bon, NP    Thank you for choosing [facility]!!      Other Instructions

## 2019-10-29 NOTE — Progress Notes (Signed)
Cardiology Office Note   Date:  10/29/2019   ID:  Kerri Sosa, Kerri Sosa 1931-01-11, MRN [id number]  PCP:  Kerri Sosa., PA-C  Cardiologist:   Kerri Miss, MD   Chief Complaint  Patient presents with  . Coronary Artery Disease   1. Dyspnea 2. Aortic stenosis - mild , for cardiac catheterization in August, 2014 revealed a peak to peak aortic valve gradient of 7 mmHg. In the past her Aortic stenosis has been graded as mild using echo. 3. Mitral regurgitation 4. CAD - s/p Inf. STEMI, s/p BMS stenting of her mid RCA. 4.0 stent dilated to 5.0 mm. ( Aug. 8, 2014), [place], [place]  5. Hypertension   Kerri Sosa is a 86 yo with hx of mild AS and MR. 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 She does not get any regular exercise.  She denies any syncopal episodes. She denies any chest pain.  Sept. 24, 2014:  Kerri Sosa was out of town and had an inferior wall ST segment elevation myocardial infarction. She was on vacation at [facility] and started having episodes of midsternal pain. She was transferred to [facility] and taken to [facility]. She had an urgent cardiac catheterization which revealed an occluded mid right coronary artery. She had a 4.0 x 20 mm bare-metal stent placed in her mid right coronary artery. He was post dilated up to 5.0 mm with a noncompliant balloon.  She is doing well at this point. No CP. She still has some dyspnea.   She's eager to get back to work. She's not had any severe complications. She measures her blood pressure on regular basis and her readings are typically in the normal range.  Dec. 4, 2014:  No cardiac symptoms. Has had a head cold.   December 14, 2013:  Kerri Sosa is doing well. She is going to visit her daughter at [facility] next week. No angina pain. 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   No CP, dyspnea, or presyncope.  Nov. 17 ,2015:  Kerri Sosa is a 86  y.o. female who I follow for her CAD ( MI while in [place], [place] Aug. 2014) and her aortic stenosis.   Kerri Sosa is doing well. Still works at her church. Goes up and down stairs without problems. Occasionally out of breath at the top of the stairs.  No Cp , no dizziness.   BP has been normal at home.  A bit elevated today.    Nov 28 2014:   Kerri Sosa is a 86 y.o. female who presents for follow up of CAD No cp BP is a bit elevated at home. BP at home is normal .     Nov. 14, 2016:  Able to do all of her normal activities. Just got back from fishing [place] the beach.    No CP or dyspnea.  BP is here in the office.  Was normal at home .  She watches her salt .   May 17 ,2017:  Doing well . No CP , no dyspnea.  Able to do all of her normal activities  BP is a bit high   Nov. 15, 2017:  Doing well.   Is short of breath.   Has DOE  She cleans the church every week.   She hauls  wood in the winter   January 16, 2017:  Kerri Sosa is seen today for follow up of  her CAD . Has very mild aortic stenosis.   Feb. 12, 2019:  Doing well. Staying active.  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  Sept. 4, 2019:  Still active. 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 Has some chest heaviness, occurs after eating .  Not necessarliy associated with exertion Recently had an episode that lasted all day  Does not take NTG or antiacid  Had an MI in [place] - had a stent placed BP is a bit high today  - has not had her meds today   October 29, 2019: Kerri Sosa is seen today for follow-up of her coronary artery disease.  She had a myocardial infarction in 2014.  She is status post stenting of her RCA. She has a history of hypertension and hyperlipidemia.  BP is a bit elevated this am.   Has been normal when she checks at home  Chronic dyspnea.   No cp     Past Medical History:  Diagnosis Date  . Asthma   . Crohn's disease (HCC)   . Heart attack (HCC)   . Hiatal hernia    . Hypertension     Past Surgical History:  Procedure Laterality Date  . APPENDECTOMY    . HERNIA REPAIR       Current Outpatient Medications  Medication Sig Dispense Refill  . amLODipine-valsartan (EXFORGE) 5-160 MG tablet Take 1 tablet by mouth daily. Please make yearly appt with Dr. Elease Hashimoto for April before anymore refills. 1st attempt 90 tablet 0  . aspirin EC 81 MG tablet Take 81 mg by mouth daily.    . bisoprolol (ZEBETA) 5 MG tablet Take 1 tablet (5 mg total) by mouth daily. 30 tablet 11  . carvedilol (COREG) 12.5 MG tablet Take 12.5 mg by mouth 2 (two) times daily with a meal.    . cetirizine (ZYRTEC) 10 MG tablet Take 10 mg by mouth daily.    . clopidogrel (PLAVIX) 75 MG tablet Take 1 tablet (75 mg total) by mouth daily. Please make yearly appt with Dr. Elease Hashimoto for April for future refills. 1st attempt 90 tablet 0  . famotidine (PEPCID) 20 MG tablet Take 20 mg by mouth at bedtime.    . Fluticasone-Salmeterol (ADVAIR DISKUS) 100-50 MCG/DOSE AEPB INHALE 1 PUFF INTO THE LUNGS 2 TIMES DAILY FOR 30 DAYS.    . hydrochlorothiazide (HYDRODIURIL) 25 MG tablet TAKE ONE TABLET BY MOUTH EVERY DAY 90 tablet 2  . ibuprofen (ADVIL,MOTRIN) 200 MG tablet Take 200 mg by mouth every 6 (six) hours as needed.    . loratadine (CLARITIN) 10 MG tablet Take 10 mg by mouth daily.    . nitroGLYCERIN (NITROSTAT) 0.4 MG SL tablet Place 1 tablet (0.4 mg total) under the tongue every 5 (five) minutes as needed for chest pain. Max three doses. Please make yearly appt with Dr. Elease Hashimoto for April. 1st attempt 25 tablet 2  . pantoprazole (PROTONIX) 40 MG tablet Take 1 tablet (40 mg total) by mouth daily before breakfast. 30 tablet 2  . potassium chloride SA (KLOR-CON M20) 20 MEQ tablet Take 1 tablet (20 mEq total) by mouth daily. Please keep upcoming appt in April with Dr. Elease Hashimoto for future refills. Thank you 90 tablet 0  . pravastatin (PRAVACHOL) 40 MG tablet Take 1 tablet by mouth as directed. 3x a week, M,W,F only      No current facility-administered medications for this visit.    Allergies:   Lipitor [atorvastatin]    Social History:  The patient  reports that she has never smoked. She has never used smokeless tobacco. She reports that she does not drink alcohol or use drugs.   Family History:  The patient's family history includes Cancer in her father; Heart disease in her mother.    ROS:   Noted in current history, all other systems are negative.   Physical Exam: Blood pressure (!) 148/88, pulse (!) 52, height 5\' 4"  (1.626 m), weight 161 lb 4 oz (73.1 kg), SpO2 96 %.  GEN:   Elderly female,  NAD  HEENT: Normal NECK: No JVD; No carotid bruits LYMPHATICS: No lymphadenopathy CARDIAC: RRR , soft systolic murmur  RESPIRATORY:  Clear to auscultation without rales, wheezing or rhonchi  ABDOMEN: Soft, non-tender, non-distended MUSCULOSKELETAL:  No edema; No deformity  SKIN: Warm and dry NEUROLOGIC:  Alert and oriented x 3   EKG:    October 29, 2019:  NSR at 37.   freq PVCs in a bigeminal pattern    Recent Labs: No results found for requested labs within last 8760 hours.    Lipid Panel    Component Value Date/Time   CHOL 180 03/18/2018 0926   TRIG 89 03/18/2018 0926   HDL 72 03/18/2018 0926   CHOLHDL 2.5 03/18/2018 0926   CHOLHDL 1.8 11/29/2015 0842   VLDL 11 11/29/2015 0842   LDLCALC 90 03/18/2018 0926      Wt Readings from Last 3 Encounters:  10/29/19 161 lb 4 oz (73.1 kg)  10/29/18 160 lb (72.6 kg)  07/27/18 161 lb 12.8 oz (73.4 kg)      Other studies Reviewed: Additional studies/ records that were reviewed today include: . Review of the above records demonstrates:    ASSESSMENT AND PLAN:  1. Dyspnea-       seems to be a chronic issue.  Is partly due to her age of 41 years.  Overall I think she is doing well.  2. Aortic stenosis  -aortic stenosis is very mild.  Continue to follow.  3. Mitral regurgitation-mild.  4. CAD - s/p Inf. STEMI, s/p BMS stenting of her  mid RCA. 4.0 stent dilated to 5.0 mm. ( Aug. 8, 2014).    She is not having any episodes of angina.  Continue current medications.    5. Hypertension -blood pressure at home is well controlled.  It was a little elevated today because she is been rushing around.  She also has not had her medications yet.  Continue current medications.       Current medicines are reviewed at length with the patient today.  The patient does not have concerns regarding medicines.  The following changes have been made:  no change  Labs/ tests ordered today include:  No orders of the defined types were placed in this encounter.   Disposition:   We will have her follow-up with an APP in 1 year.   Mertie Moores, MD  10/29/2019 8:46 AM    [address] [place], [place], [place]  [address] Phone: [phone]; Fax: [phone]

## 2019-11-02 ENCOUNTER — Telehealth: Payer: Self-pay | Admitting: Cardiovascular Disease

## 2019-11-02 DIAGNOSIS — R899 Unspecified abnormal finding in specimens from other organs, systems and tissues: Secondary | ICD-10-CM

## 2019-11-02 DIAGNOSIS — E878 Other disorders of electrolyte and fluid balance, not elsewhere classified: Secondary | ICD-10-CM

## 2019-11-02 DIAGNOSIS — E870 Hyperosmolality and hypernatremia: Secondary | ICD-10-CM

## 2019-11-02 LAB — COMPREHENSIVE METABOLIC PANEL
ALT: 10 IU/L (ref 0–32)
AST: 21 IU/L (ref 0–40)
Albumin/Globulin Ratio: 1.5 (ref 1.2–2.2)
Albumin: 4.5 g/dL (ref 3.6–4.6)
Alkaline Phosphatase: 115 IU/L (ref 39–117)
BUN/Creatinine Ratio: 22 (ref 12–28)
BUN: 28 mg/dL — ABNORMAL HIGH (ref 8–27)
Bilirubin Total: 0.3 mg/dL (ref 0.0–1.2)
CO2: 19 mmol/L — ABNORMAL LOW (ref 20–29)
Calcium: 9.8 mg/dL (ref 8.7–10.3)
Chloride: 116 mmol/L (ref 96–106)
Creatinine, Ser: 1.27 mg/dL — ABNORMAL HIGH (ref 0.57–1.00)
GFR calc Af Amer: 44 mL/min/{1.73_m2} — ABNORMAL LOW (ref >59)
GFR calc non Af Amer: 38 mL/min/{1.73_m2} — ABNORMAL LOW (ref >59)
Globulin, Total: 3 g/dL (ref 1.5–4.5)
Glucose: 97 mg/dL (ref 65–99)
Potassium: 4.4 mmol/L (ref 3.5–5.2)
Sodium: 155 mmol/L — ABNORMAL HIGH (ref 134–144)
Total Protein: 7.5 g/dL (ref 6.0–8.5)

## 2019-11-02 LAB — LIPID PANEL
Chol/HDL Ratio: 2.3 ratio (ref 0.0–4.4)
Cholesterol, Total: 179 mg/dL (ref 100–199)
HDL: 78 mg/dL (ref >39)
LDL Chol Calc (NIH): 87 mg/dL (ref 0–99)
Triglycerides: 77 mg/dL (ref 0–149)
VLDL Cholesterol Cal: 14 mg/dL (ref 5–40)

## 2019-11-02 NOTE — Telephone Encounter (Signed)
-----   Message from Vesta Mixer, MD sent at 11/02/2019  7:46 AM EDT ----- Sodium and chloride are very elevated.   Please have her drink more water.  Recheck BMP ( non fasting) this week.

## 2019-11-02 NOTE — Telephone Encounter (Signed)
Spoke with the pt and informed her of her lab results and recommendations per Dr. Elease Hashimoto.  Informed the pt that her sodium and chloride level were very elevated, and Dr. Elease Hashimoto recommends that she hydrate very well with water, and come back in this week for a BMET, which requires her not to fast. Scheduled the pt to have a repeat BMET on this Friday 4/23. Pt education provided about increasing PO water intake. Pt verbalized understanding and agrees with this plan.

## 2019-11-02 NOTE — Telephone Encounter (Signed)
Follow Up:      Pt is returning a call. She said she was told to call back and ask for Triage.

## 2019-11-05 ENCOUNTER — Other Ambulatory Visit: Payer: Self-pay

## 2019-11-05 ENCOUNTER — Other Ambulatory Visit: Payer: Medicare Other | Admitting: *Deleted

## 2019-11-05 DIAGNOSIS — E870 Hyperosmolality and hypernatremia: Secondary | ICD-10-CM

## 2019-11-05 DIAGNOSIS — R899 Unspecified abnormal finding in specimens from other organs, systems and tissues: Secondary | ICD-10-CM

## 2019-11-05 DIAGNOSIS — E878 Other disorders of electrolyte and fluid balance, not elsewhere classified: Secondary | ICD-10-CM

## 2019-11-05 LAB — BASIC METABOLIC PANEL
BUN/Creatinine Ratio: 21 (ref 12–28)
BUN: 24 mg/dL (ref 8–27)
CO2: 25 mmol/L (ref 20–29)
Calcium: 9 mg/dL (ref 8.7–10.3)
Chloride: 108 mmol/L — ABNORMAL HIGH (ref 96–106)
Creatinine, Ser: 1.15 mg/dL — ABNORMAL HIGH (ref 0.57–1.00)
GFR calc Af Amer: 50 mL/min/{1.73_m2} — ABNORMAL LOW (ref >59)
GFR calc non Af Amer: 43 mL/min/{1.73_m2} — ABNORMAL LOW (ref >59)
Glucose: 98 mg/dL (ref 65–99)
Potassium: 4.3 mmol/L (ref 3.5–5.2)
Sodium: 144 mmol/L (ref 134–144)

## 2020-01-18 ENCOUNTER — Other Ambulatory Visit: Payer: Self-pay | Admitting: Cardiovascular Disease

## 2020-02-09 ENCOUNTER — Other Ambulatory Visit: Payer: Self-pay | Admitting: Cardiovascular Disease

## 2020-02-10 MED ORDER — POTASSIUM CHLORIDE CRYS ER 20 MEQ PO TBCR: 20.0000 meq | EXTENDED_RELEASE_TABLET | Freq: Every day | ORAL | 2 refills | Status: DC

## 2020-02-14 ENCOUNTER — Other Ambulatory Visit: Payer: Self-pay | Admitting: Cardiovascular Disease

## 2020-07-27 ENCOUNTER — Other Ambulatory Visit: Payer: Self-pay | Admitting: Cardiovascular Disease

## 2020-08-23 ENCOUNTER — Other Ambulatory Visit: Payer: Self-pay | Admitting: Cardiovascular Disease

## 2020-08-24 MED ORDER — POTASSIUM CHLORIDE CRYS ER 20 MEQ PO TBCR: 20.0000 meq | EXTENDED_RELEASE_TABLET | Freq: Every day | ORAL | 0 refills | Status: DC

## 2020-08-25 IMAGING — CR DG CHEST 2V
2 series · 2 of 2 positions shown · non-contrast
Comparison: 06/04/2015

CLINICAL DATA: 87-year-old female with a history of increased
shortness of breath and slight cough

EXAM:
CHEST - 2 VIEW

[w chest pa]
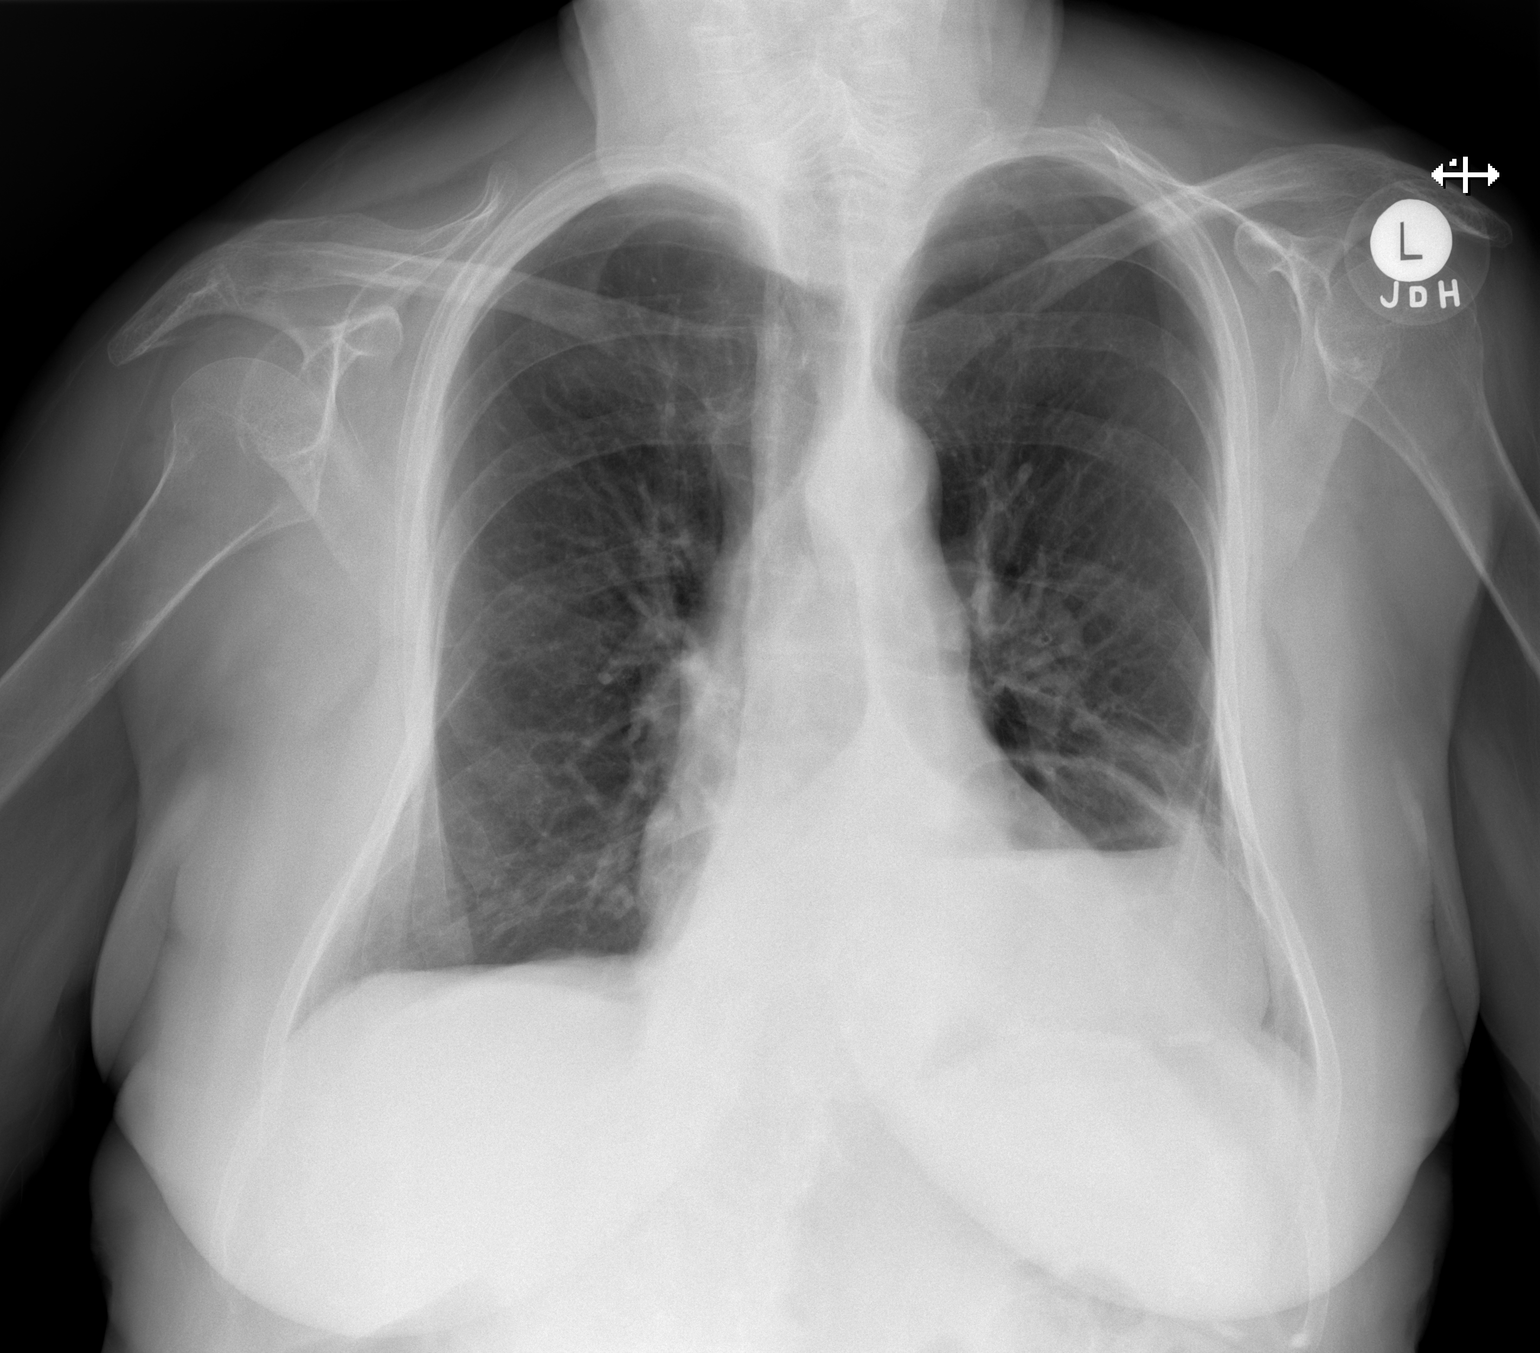

[w chest lat]
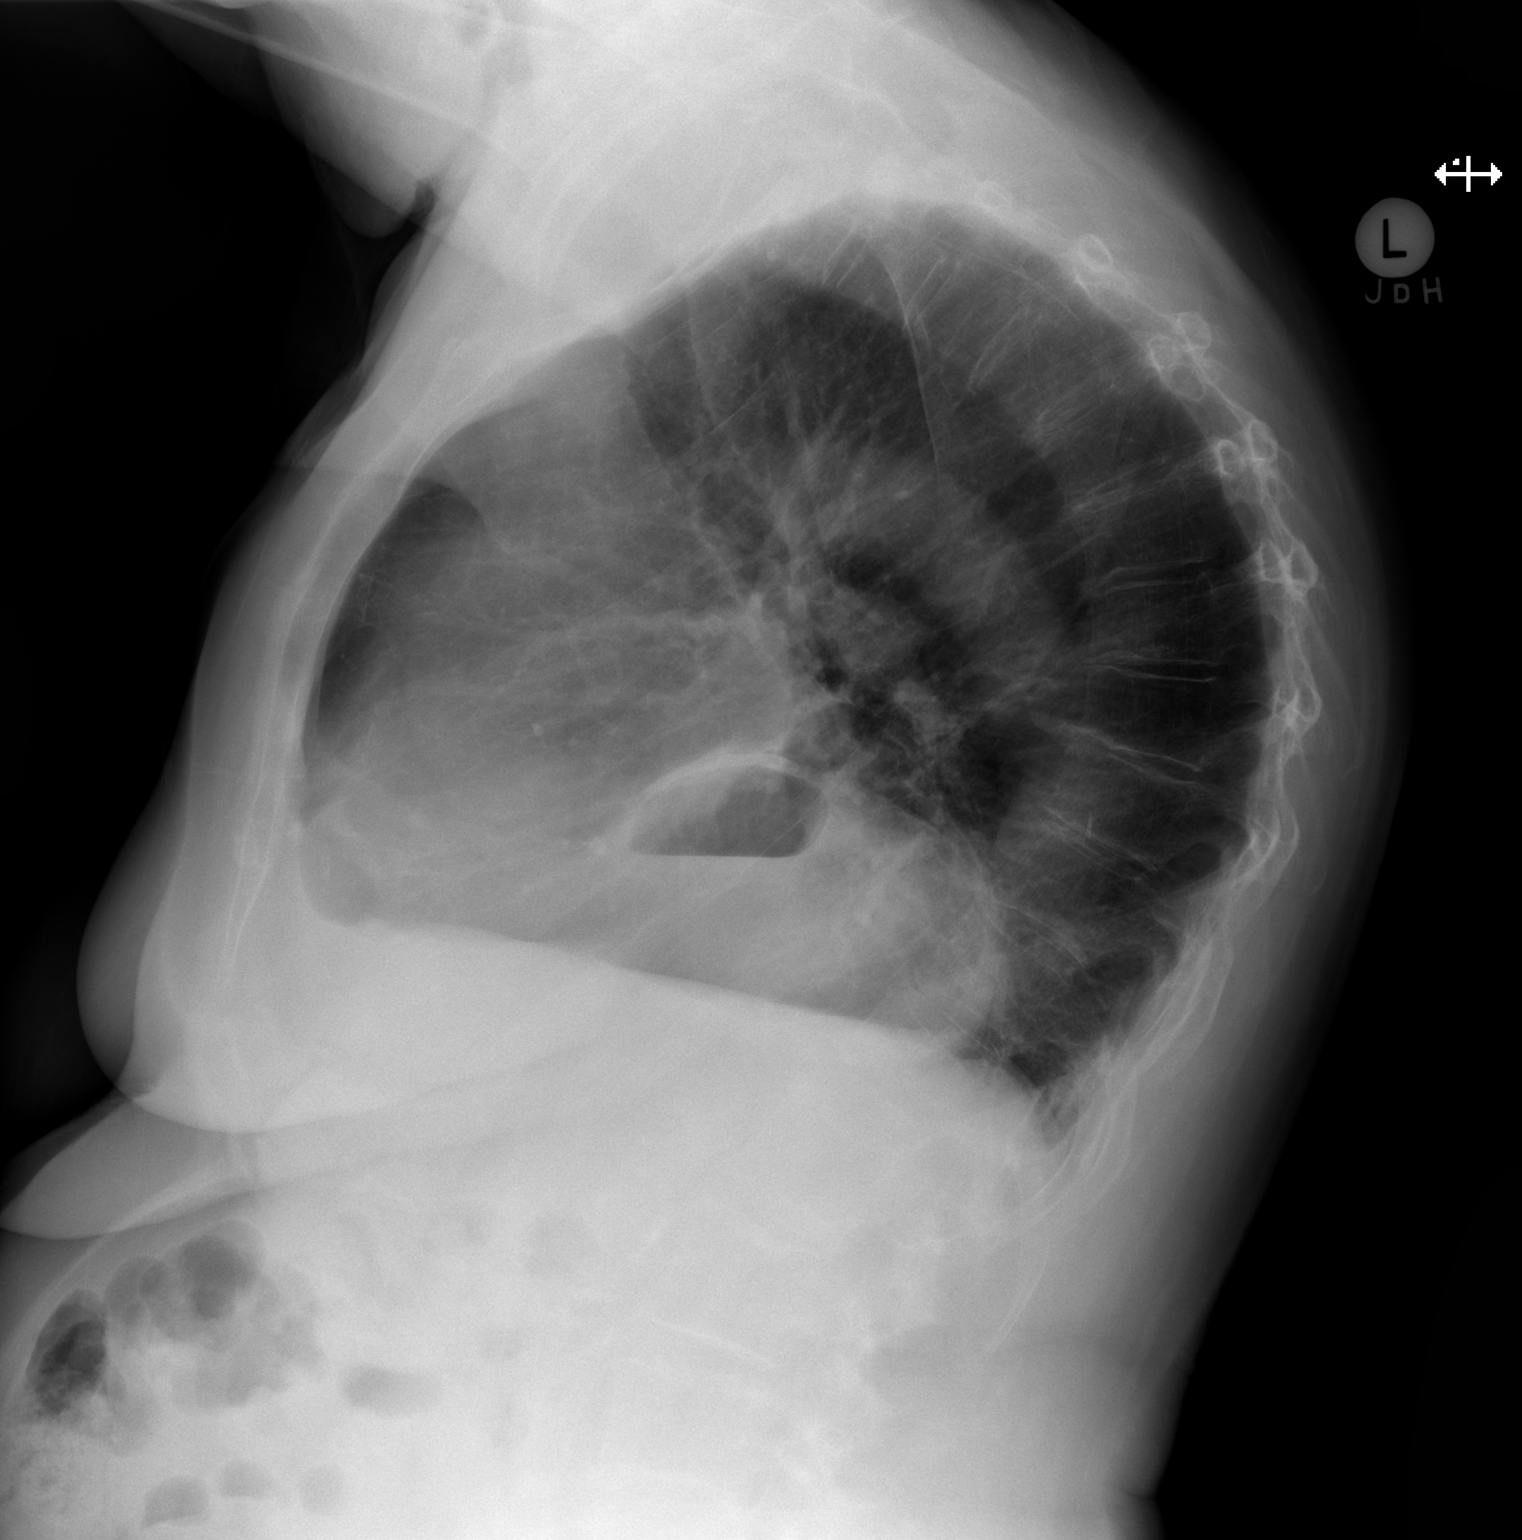

[2 of 2 positions shown; findings below may reference images not displayed]

FINDINGS: Cardiomediastinal silhouette unchanged in size and contour. No
evidence of central vascular congestion. No pneumothorax. No pleural
effusion. Double density projects over the lower mediastinum,
unchanged from the prior.

No confluent airspace disease.

Accentuated kyphotic curvature of the thoracic spine with associated
degenerative changes.
IMPRESSION: Negative for acute cardiopulmonary disease.

Hiatal hernia.

## 2020-10-29 ENCOUNTER — Encounter: Payer: Self-pay | Admitting: Cardiovascular Disease

## 2020-10-29 NOTE — Progress Notes (Signed)
Cardiology Office Note   Date:  10/29/2020   ID:  Kerri Sosa, Verbeke 1931-05-25, MRN [id number]  PCP:  Richmond Campbell., PA-C  Cardiologist:   Kristeen Miss, MD   Chief Complaint  Patient presents with  . Coronary Artery Disease   1. Dyspnea 2. Aortic stenosis - mild , for cardiac catheterization in August, 2014 revealed a peak to peak aortic valve gradient of 7 mmHg. In the past her Aortic stenosis has been graded as mild using echo. 3. Mitral regurgitation 4. CAD - s/p Inf. STEMI, s/p BMS stenting of her mid RCA. 4.0 stent dilated to 5.0 mm. ( Aug. 8, 2014), [place], [place]  5. Hypertension   [name] is a 87 yo with hx of mild AS and MR. 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 She does not get any regular exercise.  She denies any syncopal episodes. She denies any chest pain.  Sept. 24, 2014:  Nieshia was out of town and had an inferior wall ST segment elevation myocardial infarction. She was on vacation at [facility] and started having episodes of midsternal pain. She was transferred to [facility] and taken to [facility]. She had an urgent cardiac catheterization which revealed an occluded mid right coronary artery. She had a 4.0 x 20 mm bare-metal stent placed in her mid right coronary artery. He was post dilated up to 5.0 mm with a noncompliant balloon.  She is doing well at this point. No CP. She still has some dyspnea.   She's eager to get back to work. She's not had any severe complications. She measures her blood pressure on regular basis and her readings are typically in the normal range.  Dec. 4, 2014:  No cardiac symptoms. Has had a head cold.   December 14, 2013:  Kerri Sosa is doing well. She is going to visit her daughter at [facility] next week. No angina pain. 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   No CP, dyspnea, or presyncope.  Nov. 17 ,2015:  Kerri Sosa is a 87  y.o. female who I follow for her CAD ( MI while in [place], [place] Aug. 2014) and her aortic stenosis.   Kerri Sosa is doing well. Still works at her church. Goes up and down stairs without problems. Occasionally out of breath at the top of the stairs.  No Cp , no dizziness.   BP has been normal at home.  A bit elevated today.    Nov 28 2014:   Kerri Sosa is a 87 y.o. female who presents for follow up of CAD No cp BP is a bit elevated at home. BP at home is normal .     Nov. 14, 2016:  Able to do all of her normal activities. Just got back from fishing at the beach.    No CP or dyspnea.  BP is here in the office.  Was normal at home .  She watches her salt .   May 17 ,2017:  Doing well . No CP , no dyspnea.  Able to do all of her normal activities  BP is a bit high   Nov. 15, 2017:  Doing well.   Is short of breath.   Has DOE  She cleans the church every week.   She hauls  wood in the winter   January 16, 2017:  [name] is seen today for follow up of  her CAD . Has very mild aortic stenosis.   Feb. 12, 2019:  Doing well. Staying active.  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  Sept. 4, 2019:  Still active. 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 Has some chest heaviness, occurs after eating .  Not necessarliy associated with exertion Recently had an episode that lasted all day  Does not take NTG or antiacid  Had an MI in [place] - had a stent placed BP is a bit high today  - has not had her meds today   October 29, 2019: Mrs. Kerri Sosa is seen today for follow-up of her coronary artery disease.  She had a myocardial infarction in 2014.  She is status post stenting of her RCA. She has a history of hypertension and hyperlipidemia.  BP is a bit elevated this am.   Has been normal when she checks at home  Chronic dyspnea.   No cp   October 30, 2020:  [name] is seen for follow up .  Hx of CAD, hx of MI in 2014.  Hx of stenting of her RCA , htn and HLD    Labs drawn at [facility] office shows a LDL of 74.  The total cholesterol is 161.  Triglyceride level 74.  HDL is 67.  Past Medical History:  Diagnosis Date  . Asthma   . Crohn's disease (HCC)   . Heart attack (HCC)   . Hiatal hernia   . Hypertension     Past Surgical History:  Procedure Laterality Date  . APPENDECTOMY    . HERNIA REPAIR       Current Outpatient Medications  Medication Sig Dispense Refill  . amLODipine-valsartan (EXFORGE) 5-160 MG tablet Take 1 tablet by mouth daily. Please make yearly appt with Dr. Elease Hashimoto for April before anymore refills. 1st attempt 90 tablet 0  . aspirin EC 81 MG tablet Take 81 mg by mouth daily.    . bisoprolol (ZEBETA) 5 MG tablet Take 1 tablet (5 mg total) by mouth daily. 30 tablet 11  . cetirizine (ZYRTEC) 10 MG tablet Take 10 mg by mouth daily.    . clopidogrel (PLAVIX) 75 MG tablet Take 1 tablet (75 mg total) by mouth daily. Please make yearly appt with Dr. Elease Hashimoto for April 2022 for future refills. Thank you 1st attempt 90 tablet 0  . famotidine (PEPCID) 20 MG tablet Take 20 mg by mouth at bedtime.    . Fluticasone-Salmeterol (ADVAIR DISKUS) 100-50 MCG/DOSE AEPB INHALE 1 PUFF INTO THE LUNGS 2 TIMES DAILY FOR 30 DAYS.    . hydrochlorothiazide (HYDRODIURIL) 25 MG tablet TAKE ONE TABLET BY MOUTH EVERY DAY 90 tablet 0  . ibuprofen (ADVIL,MOTRIN) 200 MG tablet Take 200 mg by mouth every 6 (six) hours as needed.    . loratadine (CLARITIN) 10 MG tablet Take 10 mg by mouth daily.    . nitroGLYCERIN (NITROSTAT) 0.4 MG SL tablet Place 1 tablet (0.4 mg total) under the tongue every 5 (five) minutes as needed for chest pain. Max three doses. Please make yearly appt with Dr. Elease Hashimoto for April. 1st attempt 25 tablet 2  . pantoprazole (PROTONIX) 40 MG tablet Take 1 tablet (40 mg total) by mouth daily before breakfast. 30 tablet 2  . potassium chloride SA (KLOR-CON M20) 20 MEQ tablet Take 1 tablet (20 mEq total) by mouth daily. Please make overdue  appt with Dr. Elease Hashimoto for April 2022 for future refills. Thank you 1st attempt 90 tablet 0  .  pravastatin (PRAVACHOL) 40 MG tablet Take 1 tablet by mouth as directed. 3x a week, M,W,F only     No current facility-administered medications for this visit.    Allergies:   Lipitor [atorvastatin]    Social History:  The patient  reports that she has never smoked. She has never used smokeless tobacco. She reports that she does not drink alcohol and does not use drugs.   Family History:  The patient's family history includes Cancer in her father; Heart disease in her mother.    ROS:   Noted in current history, all other systems are negative.   Physical Exam: Blood pressure (!) 160/80, pulse 61, height 5\' 4"  (1.626 m), weight 162 lb (73.5 kg), SpO2 97 %.  GEN:   Elderly female,  NAD  HEENT: Normal NECK: No JVD; No carotid bruits LYMPHATICS: No lymphadenopathy CARDIAC: RRR  , soft systolic murmur  RESPIRATORY:  Clear to auscultation without rales, wheezing or rhonchi  ABDOMEN: Soft, non-tender, non-distended MUSCULOSKELETAL:  No edema; No deformity  SKIN: Warm and dry NEUROLOGIC:  Alert and oriented x 3   EKG:     October 30, 2020: Normal sinus rhythm at 61.  No ST or T wave changes.  Recent Labs: 11/05/2019: BUN 24; Creatinine, Ser 1.15; Potassium 4.3; Sodium 144    Lipid Panel    Component Value Date/Time   CHOL 179 10/29/2019 0856   TRIG 77 10/29/2019 0856   HDL 78 10/29/2019 0856   CHOLHDL 2.3 10/29/2019 0856   CHOLHDL 1.8 11/29/2015 0842   VLDL 11 11/29/2015 0842   LDLCALC 87 10/29/2019 0856      Wt Readings from Last 3 Encounters:  10/29/19 161 lb 4 oz (73.1 kg)  10/29/18 160 lb (72.6 kg)  07/27/18 161 lb 12.8 oz (73.4 kg)      Other studies Reviewed: Additional studies/ records that were reviewed today include: . Review of the above records demonstrates:    ASSESSMENT AND PLAN:  1. Dyspnea-       .  She overall seems to be doing well.  She is not having any  significant episodes of dyspnea.  Good bit of this is probably age-related.  2. Aortic stenosis  -she had mild aortic stenosis by echocardiogram in 2016.  She has a soft murmur today.  We will repeat her echocardiogram.  3. Mitral regurgitation- stable   4. CAD - s/p Inf. STEMI, s/p BMS stenting of her mid RCA. 4.0 stent dilated to 5.0 mm. ( Aug. 8, 2014).    She not having any angina.  Her last lipid levels were drawn at her primary medical doctors.  These labs were reviewed and her labs remained stable.   We will see her again in 1 year. .   5. Hypertension -    Current medicines are reviewed at length with the patient today.  The patient does not have concerns regarding medicines.  The following changes have been made:  no change  Labs/ tests ordered today include:  No orders of the defined types were placed in this encounter.   Disposition:     06-18-1975, MD  10/29/2020 3:46 PM    [facility] [address] [place], [place], [place]  [place] Phone: [phone]; Fax: [phone]

## 2020-10-30 ENCOUNTER — Ambulatory Visit (INDEPENDENT_AMBULATORY_CARE_PROVIDER_SITE_OTHER): Payer: Medicare Other | Admitting: Cardiovascular Disease

## 2020-10-30 ENCOUNTER — Other Ambulatory Visit: Payer: Self-pay

## 2020-10-30 ENCOUNTER — Encounter: Payer: Self-pay | Admitting: Cardiovascular Disease

## 2020-10-30 VITALS — BP 160/80 | HR 61 | Ht 64.0 in | Wt 162.0 lb

## 2020-10-30 DIAGNOSIS — I350 Nonrheumatic aortic (valve) stenosis: Secondary | ICD-10-CM

## 2020-10-30 NOTE — Patient Instructions (Signed)
Medication Instructions:  Your physician recommends that you continue on your current medications as directed. Please refer to the Current Medication list given to you today.  *If you need a refill on your cardiac medications before your next appointment, please call your pharmacy*   Lab Work: none If you have labs (blood work) drawn today and your tests are completely normal, you will receive your results only by: Marland Kitchen MyChart Message (if you have MyChart) OR . A paper copy in the mail If you have any lab test that is abnormal or we need to change your treatment, we will call you to review the results.   Testing/Procedures: Your physician has requested that you have an echocardiogram. Echocardiography is a painless test that uses sound waves to create images of your heart. It provides your doctor with information about the size and shape of your heart and how well your heart's chambers and valves are working. This procedure takes approximately one hour. There are no restrictions for this procedure.     Follow-Up: At [facility], you and your health needs are our priority.  As part of our continuing mission to provide you with exceptional heart care, we have created designated Provider Care Teams.  These Care Teams include your primary Cardiologist (physician) and Advanced Practice Providers (APPs -  Physician Assistants and Nurse Practitioners) who all work together to provide you with the care you need, when you need it.  Your next appointment:   1 year(s)  The format for your next appointment:   In Person  Provider:   You may see Kristeen Miss, MD or one of the following Advanced Practice Providers on your designated Care Team:    Tereso Newcomer, PA-C  [place], [place]

## 2020-12-01 ENCOUNTER — Other Ambulatory Visit: Payer: Self-pay

## 2020-12-01 ENCOUNTER — Ambulatory Visit (HOSPITAL_COMMUNITY): Payer: Medicare Other | Attending: Internal Medicine

## 2020-12-01 DIAGNOSIS — I350 Nonrheumatic aortic (valve) stenosis: Secondary | ICD-10-CM | POA: Diagnosis not present

## 2020-12-01 LAB — ECHOCARDIOGRAM COMPLETE
AR max vel: 1.04 cm2
AV Area VTI: 1.29 cm2
AV Area mean vel: 1.09 cm2
AV Mean grad: 18 mmHg
AV Peak grad: 31.6 mmHg
Ao pk vel: 2.81 m/s
Area-P 1/2: 3.08 cm2
MV M vel: 5.05 m/s
MV Peak grad: 102 mmHg
Radius: 0.5 cm
S' Lateral: 2.8 cm

## 2020-12-01 NOTE — Progress Notes (Signed)
Patient ID: Kerri Sosa, female   DOB: Oct 16, 1930, 87 y.o.   MRN: [id number]  Echocardiogram 2D Echocardiogram has been performed.  Tye Savoy 12/01/20

## 2021-02-13 ENCOUNTER — Other Ambulatory Visit: Payer: Self-pay | Admitting: Cardiovascular Disease

## 2021-03-08 ENCOUNTER — Other Ambulatory Visit: Payer: Self-pay | Admitting: Cardiovascular Disease

## 2021-03-20 ENCOUNTER — Other Ambulatory Visit: Payer: Self-pay | Admitting: Cardiovascular Disease

## 2021-05-24 ENCOUNTER — Other Ambulatory Visit: Payer: Self-pay | Admitting: Cardiovascular Disease

## 2021-08-30 ENCOUNTER — Other Ambulatory Visit: Payer: Self-pay | Admitting: Cardiovascular Disease

## 2021-09-25 ENCOUNTER — Other Ambulatory Visit: Payer: Self-pay | Admitting: Cardiovascular Disease

## 2021-09-26 ENCOUNTER — Other Ambulatory Visit: Payer: Self-pay | Admitting: *Deleted

## 2021-09-26 MED ORDER — CLOPIDOGREL BISULFATE 75 MG PO TABS: 75.0000 mg | ORAL_TABLET | Freq: Every day | ORAL | 1 refills | Status: DC

## 2021-10-08 ENCOUNTER — Other Ambulatory Visit: Payer: Self-pay | Admitting: Cardiovascular Disease

## 2021-11-12 ENCOUNTER — Encounter: Payer: Self-pay | Admitting: Physician Assistant

## 2021-11-12 ENCOUNTER — Ambulatory Visit (INDEPENDENT_AMBULATORY_CARE_PROVIDER_SITE_OTHER): Payer: Medicare Other | Admitting: Physician Assistant

## 2021-11-12 VITALS — BP 146/78 | HR 58 | Ht 62.0 in | Wt 158.2 lb

## 2021-11-12 DIAGNOSIS — R059 Cough, unspecified: Secondary | ICD-10-CM

## 2021-11-12 DIAGNOSIS — R490 Dysphonia: Secondary | ICD-10-CM | POA: Diagnosis not present

## 2021-11-12 DIAGNOSIS — R1310 Dysphagia, unspecified: Secondary | ICD-10-CM

## 2021-11-12 DIAGNOSIS — K219 Gastro-esophageal reflux disease without esophagitis: Secondary | ICD-10-CM | POA: Diagnosis not present

## 2021-11-12 DIAGNOSIS — K449 Diaphragmatic hernia without obstruction or gangrene: Secondary | ICD-10-CM

## 2021-11-12 NOTE — Progress Notes (Signed)
? ?Subjective:  ? ? Patient ID: Kerri Sosa, female    DOB: 08/30/1930, 88 y.o.   MRN: [id number] ? ?HPI ?Kerri Sosa is a pleasant 88 year old white female, new to GI today, referred by her PCP/Kristen Gwenlyn Saran- known very remotely to Dr. Arlyce Dice having undergone EGD and colonoscopy in 2003. ?Patient comes in today with complaints of intermittent solid food dysphagia.  She denies any difficulty with liquids.  She says foods like berries will sometimes feel as if they get stuck in the back of her throat and will induce coughing or choking.  She is able to swallow heavier foods like meat and bread but says she has gotten in the habit of taking very small bites which seems to help.  She denies any heartburn or indigestion, she feels that she has a lot of thick saliva in the back of her throat most of the time and also has intermittent hoarseness or soreness in her throat. ?She has been on chronic PPI therapy and over the past 6 months has been on twice daily Protonix.  She has not really noticed any improvement in her symptoms with this.  She denies any chest pain, no nausea or vomiting, no abdominal pain. ?Appetite has been okay ? ?Patient has history of coronary artery disease, congestive heart failure (last EF 60 to 65% 2021), history of chronic asthma, hypertension, diverticulosis and remote history of Crohn's disease which has been in remission for many years.  She is on Plavix and aspirin. ?EGD had been done here in 2003 with finding of a distal stricture with lumen of 15 mm, not dilated as patient did not have symptoms at that time, also noted to have an 8 cm hiatal hernia and Cameron erosions. ?Subsequent upper GI was done to rule out paraesophageal hernia and this did not show any evidence of volvulus. ?Colonoscopy in 2003 showed evidence of mild ileitis, no evidence of colitis, descending colon diverticulosis. ? ?Review of Systems Pertinent positive and negative review of systems were noted in the above HPI  section.  All other review of systems was otherwise negative.  ? ?Outpatient Encounter Medications as of 11/12/2021  ?Medication Sig  ? amLODipine-valsartan (EXFORGE) 5-160 MG tablet Take 1 tablet by mouth daily. Please make yearly appt with Dr. Elease Hashimoto for April before anymore refills. 1st attempt  ? aspirin EC 81 MG tablet Take 81 mg by mouth daily.  ? bisoprolol (ZEBETA) 5 MG tablet Take 1 tablet (5 mg total) by mouth daily.  ? cetirizine (ZYRTEC) 10 MG tablet Take 10 mg by mouth daily.  ? clopidogrel (PLAVIX) 75 MG tablet Take 1 tablet (75 mg total) by mouth daily. Patient needs appointment for any future refills. Please call office at [phone] to schedule appointment. 1st attempt.  ? famotidine (PEPCID) 20 MG tablet Take 20 mg by mouth at bedtime.  ? Fluticasone-Salmeterol (ADVAIR) 100-50 MCG/DOSE AEPB INHALE 1 PUFF INTO THE LUNGS 2 TIMES DAILY FOR 30 DAYS.  ? hydrochlorothiazide (HYDRODIURIL) 25 MG tablet Take 1 tablet (25 mg total) by mouth daily. Please schedule yearly appointment for future refills. Thank you  ? ibuprofen (ADVIL,MOTRIN) 200 MG tablet Take 200 mg by mouth every 6 (six) hours as needed.  ? loratadine (CLARITIN) 10 MG tablet Take 10 mg by mouth daily.  ? nitroGLYCERIN (NITROSTAT) 0.4 MG SL tablet Place 1 tablet (0.4 mg total) under the tongue every 5 (five) minutes as needed for chest pain. Max three doses. Please make yearly appt with Dr. Elease Hashimoto for April.  1st attempt  ? pantoprazole (PROTONIX) 40 MG tablet Take 1 tablet (40 mg total) by mouth daily before breakfast.  ? potassium chloride SA (KLOR-CON) 20 MEQ tablet Take 1 tablet (20 mEq total) by mouth daily.  ? pravastatin (PRAVACHOL) 40 MG tablet Take 1 tablet by mouth as directed. 3x a week, M,W,F only  ? ?No facility-administered encounter medications on file as of 11/12/2021.  ? ?Allergies  ?Allergen Reactions  ? Lipitor [Atorvastatin] Other (See Comments)  ?  ELEVATES LIVER ENZYMES  ? ?Patient Active Problem List  ? Diagnosis Date Noted   ? Chronic asthma, severe persistent, uncomplicated 06/27/2018  ? Upper airway cough syndrome 05/16/2018  ? DOE (dyspnea on exertion) 05/15/2018  ? Chronic diastolic heart failure (HCC) 06/06/2015  ? Acute chest pain 06/04/2015  ? CAD (coronary artery disease) 04/07/2013  ? Hyperlipidemia 04/07/2013  ? Essential hypertension 04/07/2013  ? Aortic stenosis 06/25/2012  ? Hiatal hernia   ? STRICTURE AND STENOSIS OF ESOPHAGUS 11/18/2007  ? CROHN'S DISEASE 11/18/2007  ? HEMORRHOIDS, EXTERNAL 04/20/2002  ? DIVERTICULOSIS OF COLON 04/20/2002  ? GASTRITIS 04/19/2002  ? DUODENITIS, WITHOUT HEMORRHAGE 04/19/2002  ? ?Social History  ? ?Socioeconomic History  ? Marital status: Widowed  ?  Spouse name: Not on file  ? Number of children: 7  ? Years of education: Not on file  ? Highest education level: Not on file  ?Occupational History  ? Occupation: Retired  ?Tobacco Use  ? Smoking status: Never  ? Smokeless tobacco: Never  ?Vaping Use  ? Vaping Use: Never used  ?Substance and Sexual Activity  ? Alcohol use: No  ? Drug use: No  ? Sexual activity: Not on file  ?Other Topics Concern  ? Not on file  ?Social History Narrative  ? Not on file  ? ?Social Determinants of Health  ? ?Financial Resource Strain: Not on file  ?Food Insecurity: Not on file  ?Transportation Needs: Not on file  ?Physical Activity: Not on file  ?Stress: Not on file  ?Social Connections: Not on file  ?Intimate Partner Violence: Not on file  ? ? ?[name]'s family history includes Breast cancer in her sister; Cancer in her father; Heart disease in her mother; Hypertension in her daughter. ? ? ?   ?Objective:  ?  ?Vitals:  ? 11/12/21 1011  ?BP: (!) 146/78  ?Pulse: (!) 58  ?SpO2: 97%  ? ? ?Physical Exam Well-developed well-nourished elderly white female in no acute distress.  Accompanied by her daughter height, Weight, 158 BMI 28.9 ? ?HEENT; nontraumatic normocephalic, EOMI, PE R LA, sclera anicteric. ?Oropharynx; not examined today ?Neck; supple, no  JVD ?Cardiovascular; regular rate and rhythm with S1-S2, no murmur rub or gallop ?Pulmonary; Clear bilaterally ?Abdomen; soft, nontender, nondistended, no palpable mass or hepatosplenomegaly, bowel sounds are active ?Rectal; not done today ?Skin; benign exam, no jaundice rash or appreciable lesions ?Extremities; no clubbing cyanosis or edema skin warm and dry ?Neuro/Psych; alert and oriented x4, grossly nonfocal mood and affect appropriate  ? ? ? ?   ?Assessment & Plan:  ? ?#11 88 year old white female with intermittent solid food dysphagia, episodes of choking and coughing more predominant than symptoms of food lodging in the esophagus.  Patient has intermittent hoarseness and a sensation of a sore throat intermittently.  No heartburn or indigestion type symptoms.  Denies chest pain ? ?She has a previously documented large hiatal hernia on EGD in 2003 with 8 cm  hiatal hernia, with Sheria Lang erosions and a benign distal stricture  which was not dilated, lumen 15 mm. ?Upper GI 2003 showed no evidence for paraesophageal hernia ? ?Current symptoms are not typical for symptoms seen with distal stricture, currently describing more coughing and choking episodes with certain foods.  Rule out presbyesophagus/motility disorder ?Rule out transfer dysphagia ?She certainly may have had significant enlargement of the hiatal hernia over the past 20 years.,  Rule out intrathoracic stomach ? ?#2 coronary artery disease that is post stent ?#3 chronic antiplatelet therapy-on Plavix and aspirin ?#4 diverticulosis ?5.  Asthma ?6.  Congestive heart failure ?7.  Chronic kidney disease ?8.  History of ileal Crohn's disease, inactive/remission for many years and has not required any medication ? ?Plan; Continue careful chewing, cutting foods into very small pieces and sipping fluids between bites ?will schedule for barium swallow/upper GI ?For now continue Protonix 40 mg p.o. twice daily AC. ?Patient will be established with Dr.  Tomasa Rand ? ? ? ?Kayler Rise S Sabri Teal PA-C ?11/12/2021 ? ? ?Cc: Richmond Campbell., PA-C ?  ?

## 2021-11-12 NOTE — Patient Instructions (Signed)
If you are age 86 or older, your body mass index should be between 23-30. Your Body mass index is 28.94 kg/m?Marland Kitchen If this is out of the aforementioned range listed, please consider follow up with your Primary Care Provider. ?________________________________________________________ ? ?The [place] [facility] providers would like to encourage you to use [facility] to communicate with providers for non-urgent requests or questions.  Due to long hold times on the telephone, sending your provider a message by [facility] may be a faster and more efficient way to get a response.  Please allow 48 business hours for a response.  Please remember that this is for non-urgent requests.  ?_______________________________________________________ ? ?You have been scheduled for a Barium Esophogram at [facility] (1st floor of the hospital) on 11/27/2021 at 9:30 am. Please arrive 15 minutes prior to your appointment for registration. Make certain not to have anything to eat or drink 3 hours prior to your test. If you need to reschedule for any reason, please contact radiology at [phone] to do so. ?__________________________________________________________________ ?A barium swallow is an examination that concentrates on views of the esophagus. This tends to be a double contrast exam (barium and two liquids which, when combined, create a gas to distend the wall of the oesophagus) or single contrast (non-ionic iodine based). The study is usually tailored to your symptoms so a good history is essential. Attention is paid during the study to the form, structure and configuration of the esophagus, looking for functional disorders (such as aspiration, dysphagia, achalasia, motility and reflux) ?EXAMINATION ?You may be asked to change into a gown, depending on the type of swallow being performed. A radiologist and radiographer will perform the procedure. The radiologist will advise you of the ?type of contrast selected for your procedure and  direct you during the exam. You will be asked to stand, sit or lie in several different positions and to hold a small amount of fluid in your mouth before being asked to swallow while the imaging is performed .In some instances you may be asked to swallow barium coated marshmallows to assess the motility of a solid food bolus. ?The exam can be recorded as a digital or video fluoroscopy procedure. ?POST PROCEDURE ?It will take 1-2 days for the barium to pass through your system. To facilitate this, it is important, unless otherwise directed, to increase your fluids for the next 24-48hrs and to resume your normal diet.  ?This test typically takes about 30 minutes to perform. ?__________________________________________________________________________________ ? ?You have been scheduled for an Upper GI Series at [facility]. Your appointment is on 11/27/2021 at 10:30 am. Please arrive 15 minutes prior to your test for registration. Make sure not to eat or drink anything after midnight on the night before your test. If you need to reschedule, please call radiology at [phone]. ?________________________________________________________________ ?An upper GI series uses x rays to help diagnose problems of the upper GI tract, which includes the esophagus, stomach, and duodenum. The duodenum is the first part of the small intestine. ?An upper GI series is conducted by a radiology technologist or a radiologist--a doctor who specializes in x-ray imaging--at a hospital or outpatient center. ?While sitting or standing in front of an x-ray machine, the patient drinks barium liquid, which is often white and has a chalky consistency and taste. The barium liquid coats the lining of the upper GI tract and makes signs of disease show up more clearly on x rays. X-ray video, called fluoroscopy, is used to view the barium liquid  moving through the esophagus, stomach, and duodenum. ?Additional x rays and fluoroscopy are performed while  the patient lies on an x-ray table. To fully coat the upper GI tract with barium liquid, the technologist or radiologist may press on the abdomen or ask the patient to change position. Patients hold still in various positions, allowing the technologist or radiologist to take x rays of the upper GI tract at different angles. If a technologist conducts the upper GI series, a radiologist will later examine the images to look for problems. ? ?This test typically takes about 1 hour to complete. ?__________________________________________________________________ ? ?Follow up pending. ? ?Thank you for entrusting me with your care and choosing [facility]. ? ?Amy Esterwood, PA-C ? ?

## 2021-11-15 NOTE — Progress Notes (Signed)
Agree with the assessment and plan as outlined by Amy Esterwood, PA-C.  Jaden Abreu E. Tavarus Poteete, MD  

## 2021-11-27 ENCOUNTER — Encounter (HOSPITAL_COMMUNITY): Payer: Self-pay

## 2021-11-27 ENCOUNTER — Ambulatory Visit (HOSPITAL_COMMUNITY)
Admission: RE | Admit: 2021-11-27 | Discharge: 2021-11-27 | Disposition: A | Discharge status: Home / Self Care | Payer: Medicare Other | Source: Ambulatory Visit | Attending: Physician Assistant | Admitting: Physician Assistant

## 2021-11-27 DIAGNOSIS — R059 Cough, unspecified: Secondary | ICD-10-CM

## 2021-11-27 DIAGNOSIS — R1310 Dysphagia, unspecified: Secondary | ICD-10-CM

## 2021-11-27 DIAGNOSIS — K219 Gastro-esophageal reflux disease without esophagitis: Secondary | ICD-10-CM | POA: Diagnosis not present

## 2021-11-27 DIAGNOSIS — K449 Diaphragmatic hernia without obstruction or gangrene: Secondary | ICD-10-CM | POA: Insufficient documentation

## 2021-11-27 DIAGNOSIS — R490 Dysphonia: Secondary | ICD-10-CM | POA: Insufficient documentation

## 2021-12-04 NOTE — Progress Notes (Signed)
Kerri Sosa,  The barium swallow study showed a very large hiatal hernia, with the majority of your stomach located in your chest cavity.  This is the most likely cause of your upper GI symptoms.  No significant narrowing of the esophagus was noted.  Contrast flowed easily from esophagus into stomach, as did the barium tablet. The only solution for the symptoms would be a surgical repair of the hiatal hernia.  Given your age, I am not certain that a surgeon would offer you an elective surgery.  I would be happy to place a referral to the surgeons that you can discuss this further if you like.  In the absence of surgery, recommendations would be to continue acid suppressive therapy and dietary modifications to reduce symptoms (small, more frequent meals). I would be happy to discuss this further with you in the clinic if desired.

## 2021-12-23 ENCOUNTER — Encounter: Payer: Self-pay | Admitting: Cardiovascular Disease

## 2021-12-23 NOTE — Progress Notes (Unsigned)
Cardiology Office Note   Date:  12/24/2021   ID:  Tasheba, Sellards 1931/05/01, MRN [id number]  PCP:  Richmond Campbell., PA-C  Cardiologist:   Kristeen Miss, MD   Chief Complaint  Patient presents with   Coronary Artery Disease        1. Dyspnea 2. Aortic stenosis - mild , for cardiac catheterization in August, 2014 revealed a peak to peak aortic valve gradient of 7 mmHg. In the past her Aortic stenosis has been graded as mild using echo. 3. Mitral regurgitation 4. CAD - s/p Inf. STEMI, s/p BMS stenting of her mid RCA. 4.0 stent dilated to 5.0 mm. ( Aug. 8, 2014), [place], [place]  5. Hypertension   Ms. Silvan is a 88 yo with hx of mild AS and MR.  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  She does not get any regular exercise.   She denies any syncopal episodes. She denies any chest pain.  Sept. 24, 2014:  Marysol was out of town and had an inferior wall ST segment elevation myocardial infarction. She was on vacation at  [facility] and started having episodes of midsternal pain.   She was transferred to [facility] and taken to [facility]. She had an urgent cardiac catheterization which revealed an occluded mid right coronary artery.   She had a 4.0 x 20 mm bare-metal stent placed in her mid right coronary artery. He was post dilated up to 5.0 mm with a noncompliant balloon.  She is doing well at this point.   No CP.  She still has some dyspnea.   She's eager to get back to work. She's not had any   severe complications.  She measures her blood pressure on regular basis and her readings are typically in the normal range.  Dec. 4, 2014:  No cardiac symptoms.  Has had a head cold.    December 14, 2013:  Andreina is 88 doing well.   She is going to visit her daughter at [facility] next week. No angina pain. 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   No CP, dyspnea, or presyncope.  Nov. 17 ,2015:  Aaiza is  a 88 y.o. female who I follow for her CAD ( MI while in [place], [place] Aug. 2014) and her aortic stenosis.   Tyneka is doing well.  Still works at her church.  Goes up and down stairs without problems. Occasionally out of breath at the top of the stairs.   No Cp , no dizziness.    BP has been normal at home.   A bit elevated today.    Nov 28 2014:   ARTURO GUTERMUTH is a 88 y.o. female who presents for follow up of CAD No cp BP is a bit elevated at home. BP at home is normal .     Nov. 14, 2016:  Able to do all of her normal activities. Just got back from fishing at the beach.    No CP or dyspnea.  BP is here in the office.  Was normal at home .  She watches her salt .   May 17 ,2017:  Doing well . No CP , no dyspnea.  Able to do all of her normal activities  BP is a bit high   Nov. 15, 2017:  Doing well.   Is short of breath.   Has DOE  She cleans the church every week.   She hauls  wood in the winter   January 16, 2017:  Ms. Sampat is seen today for follow up of her CAD . Has very mild aortic stenosis.   Feb. 12, 2019:  Doing well. Staying active.  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  Sept. 4, 2019:  Still active. 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 Has some chest heaviness, occurs after eating .  Not necessarliy associated with exertion Recently had an episode that lasted all day  Does not take NTG or antiacid  Had an MI in [place] - had a stent placed BP is a bit high today  - has not had her meds today   October 29, 2019: Mrs. Heitzmann is seen today for follow-up of her coronary artery disease.  She had a myocardial infarction in 2014.  She is status post stenting of her RCA. She has a history of hypertension and hyperlipidemia.  BP is a bit elevated this am.   Has been normal when she checks at home  Chronic dyspnea.   No cp   October 30, 2020:  Ms. Gent is seen for follow up .  Hx of CAD, hx of MI in 2014.  Hx of stenting of her RCA , htn  and HLD   Labs drawn at [facility] office shows a LDL of 74.  The total cholesterol is 161.  Triglyceride level 74.  HDL is 67.  December 24, 2021 Vikki is seen today for follow up of her CAD ( hx of MI in 2014) Hx of HLD, HTN, mild AS on exam and echo  No cardiac issues Had some RUQ pain the other day .  Thought it was indigestion     Past Medical History:  Diagnosis Date   Asthma    Crohn's disease ([place])    Heart attack ([place])    Hiatal hernia    Hypertension     Past Surgical History:  Procedure Laterality Date   ABDOMINAL HYSTERECTOMY     APPENDECTOMY     HERNIA REPAIR       Current Outpatient Medications  Medication Sig Dispense Refill   aspirin EC 81 MG tablet Take 81 mg by mouth daily.     bisoprolol (ZEBETA) 5 MG tablet Take 1 tablet (5 mg total) by mouth daily. 30 tablet 11   cetirizine (ZYRTEC) 10 MG tablet Take 10 mg by mouth daily.     famotidine (PEPCID) 20 MG tablet Take 20 mg by mouth at bedtime.     Fluticasone-Salmeterol (ADVAIR) 100-50 MCG/DOSE AEPB INHALE 1 PUFF INTO THE LUNGS 2 TIMES DAILY FOR 30 DAYS.     ibuprofen (ADVIL,MOTRIN) 200 MG tablet Take 200 mg by mouth every 6 (six) hours as needed.     loratadine (CLARITIN) 10 MG tablet Take 10 mg by mouth daily.     pantoprazole (PROTONIX) 40 MG tablet Take 1 tablet (40 mg total) by mouth daily before breakfast. 30 tablet 2   potassium chloride SA (KLOR-CON) 20 MEQ tablet Take 1 tablet (20 mEq total) by mouth daily. 90 tablet 2   pravastatin (PRAVACHOL) 40 MG tablet Take 1 tablet by mouth as directed. 3x a week, M,W,F only     amLODipine-valsartan (EXFORGE) 5-160 MG tablet Take 1 tablet by mouth daily. 30 tablet 11   clopidogrel (PLAVIX) 75 MG tablet Take 1 tablet (75 mg total) by mouth daily. 30 tablet 11   hydrochlorothiazide (HYDRODIURIL) 25  MG tablet Take 1 tablet (25 mg total) by mouth daily. 30 tablet 11   nitroGLYCERIN (NITROSTAT) 0.4 MG SL tablet Place 1 tablet (0.4 mg total) under the tongue every 5  (five) minutes as needed for chest pain. Max three doses. 25 tablet 2   No current facility-administered medications for this visit.    Allergies:   Lipitor [atorvastatin]    Social History:  The patient  reports that she has never smoked. She has never used smokeless tobacco. She reports that she does not drink alcohol and does not use drugs.   Family History:  The patient's family history includes Breast cancer in her sister; Cancer in her father; Heart disease in her mother; Hypertension in her daughter.    ROS:   Noted in current history, all other systems are negative.  Physical Exam: Blood pressure 136/80, pulse (!) 57, height 5\' 2"  (1.575 m), weight 156 lb 3.2 oz (70.9 kg), SpO2 97 %.  GEN:  Well nourished, well developed in no acute distress HEENT: Normal NECK: No JVD; No carotid bruits LYMPHATICS: No lymphadenopathy CARDIAC: RRR [id number] systolic murmur  RESPIRATORY:  Clear to auscultation without rales, wheezing or rhonchi  ABDOMEN: Soft, non-tender, non-distended MUSCULOSKELETAL:  No edema; No deformity  SKIN: Warm and dry NEUROLOGIC:  Alert and oriented x 3  EKG:      December 24, 2021: Sinus bradycardia  at 57 with occasional premature atrial contractions.  Otherwise normal EKG.  Recent Labs: No results found for requested labs within last 365 days.    Lipid Panel    Component Value Date/Time   CHOL 179 10/29/2019 0856   TRIG 77 10/29/2019 0856   HDL 78 10/29/2019 0856   CHOLHDL 2.3 10/29/2019 0856   CHOLHDL 1.8 11/29/2015 0842   VLDL 11 11/29/2015 0842   LDLCALC 87 10/29/2019 0856      Wt Readings from Last 3 Encounters:  12/24/21 156 lb 3.2 oz (70.9 kg)  11/12/21 158 lb 3.2 oz (71.8 kg)  10/30/20 162 lb (73.5 kg)      Other studies Reviewed: Additional studies/ records that were reviewed today include: . Review of the above records demonstrates:    ASSESSMENT AND PLAN:  1. Dyspnea-       stable   2. Aortic stenosis  -echo from 2022 shows mild  aortic stenosis.  No changes in plans at this point.  3. Mitral regurgitation-    4. CAD - s/p Inf. STEMI, s/p BMS stenting of her mid RCA. 4.0 stent dilated to 5.0 mm. ( Aug. 8, 2014).    Not having any episodes of chest discomfort.  5.  Hypertension: Stable.   We will have her see an APP in 1 year.  Current medicines are reviewed at length with the patient today.  The patient does not have concerns regarding medicines.  The following changes have been made:  no change  Labs/ tests ordered today include:   Orders Placed This Encounter  Procedures   EKG 12-Lead    Disposition:     Mertie Moores, MD  12/24/2021 10:16 AM    [address], [place], [place]  [address] Phone: [phone]; Fax: [phone]

## 2021-12-24 ENCOUNTER — Ambulatory Visit (INDEPENDENT_AMBULATORY_CARE_PROVIDER_SITE_OTHER): Payer: Medicare Other | Admitting: Cardiovascular Disease

## 2021-12-24 ENCOUNTER — Encounter: Payer: Self-pay | Admitting: Cardiovascular Disease

## 2021-12-24 VITALS — BP 136/80 | HR 57 | Ht 62.0 in | Wt 156.2 lb

## 2021-12-24 DIAGNOSIS — I350 Nonrheumatic aortic (valve) stenosis: Secondary | ICD-10-CM

## 2021-12-24 DIAGNOSIS — I2510 Atherosclerotic heart disease of native coronary artery without angina pectoris: Secondary | ICD-10-CM

## 2021-12-24 DIAGNOSIS — E782 Mixed hyperlipidemia: Secondary | ICD-10-CM

## 2021-12-24 MED ORDER — NITROGLYCERIN 0.4 MG SL SUBL
0.4000 mg | SUBLINGUAL_TABLET | SUBLINGUAL | 2 refills | Status: DC | PRN
Start: 2021-12-24 — End: 2023-02-18

## 2021-12-24 MED ORDER — HYDROCHLOROTHIAZIDE 25 MG PO TABS: 25.0000 mg | ORAL_TABLET | Freq: Every day | ORAL | 11 refills | Status: DC

## 2021-12-24 MED ORDER — CLOPIDOGREL BISULFATE 75 MG PO TABS: 75.0000 mg | ORAL_TABLET | Freq: Every day | ORAL | 11 refills | Status: DC

## 2021-12-24 MED ORDER — AMLODIPINE BESYLATE-VALSARTAN 5-160 MG PO TABS: 1.0000 | ORAL_TABLET | Freq: Every day | ORAL | 11 refills | Status: DC

## 2021-12-24 NOTE — Patient Instructions (Signed)
Medication Instructions:  REFILLED Medications (4) *If you need a refill on your cardiac medications before your next appointment, please call your pharmacy*   Lab Work: NONE If you have labs (blood work) drawn today and your tests are completely normal, you will receive your results only by: MyChart Message (if you have MyChart) OR A paper copy in the mail If you have any lab test that is abnormal or we need to change your treatment, we will call you to review the results.   Testing/Procedures: NONE   Follow-Up: At [facility], you and your health needs are our priority.  As part of our continuing mission to provide you with exceptional heart care, we have created designated Provider Care Teams.  These Care Teams include your primary Cardiologist (physician) and Advanced Practice Providers (APPs -  Physician Assistants and Nurse Practitioners) who all work together to provide you with the care you need, when you need it.   Your next appointment:   1 year(s)  The format for your next appointment:   In Person  Provider:   Eligha Bridegroom, NP {    Important Information About Sugar

## 2021-12-26 ENCOUNTER — Other Ambulatory Visit: Payer: Self-pay | Admitting: Cardiovascular Disease

## 2022-01-18 ENCOUNTER — Other Ambulatory Visit: Payer: Self-pay | Admitting: Cardiovascular Disease

## 2022-04-29 ENCOUNTER — Telehealth: Payer: Self-pay | Admitting: Cardiovascular Disease

## 2022-04-29 NOTE — Telephone Encounter (Signed)
Patient called to report her primary care doctor received lab work which shows her kidneys are not functioning properly.  Patient stated she would like to discuss medication change (HTZ - blood pressure).

## 2022-05-01 NOTE — Telephone Encounter (Signed)
Returned call to patient at both numbers on file, but no answer at either. Left message asking that she call back in reference to her concern about kidney functioning and recent labs. Chart shows Creatinine on 04/23/22 was 1.32. Prior labs were from 6 months ago and show Creatinine of 1.14 on 10/16/21. Patient takes HCTZ 25 mg daily. Will route to MD for review and thoughts on needed med adjustment based on these lab results.

## 2022-05-07 NOTE — Telephone Encounter (Signed)
Nahser, Wonda Cheng, MD  Donnalee Curry K Caller: Unspecified (1 week ago) Will be glad to have her return to see an APP or me in the next several weeks  This creatinine elevation is mild and is likely not significant.   PN         Left detailed message for patient per DPR letting her know that Dr Acie Fredrickson was not concerned with the slight elevation in creatinine and wasn't inclined to make any medication changes at this time. Requested that if she'd like to discuss further, to call office back, and we'd get her added to the schedule to be seen.

## 2022-08-05 ENCOUNTER — Other Ambulatory Visit: Payer: Self-pay | Admitting: Cardiovascular Disease

## 2022-10-11 ENCOUNTER — Other Ambulatory Visit: Payer: Self-pay | Admitting: Cardiovascular Disease

## 2022-12-23 NOTE — Progress Notes (Signed)
Cardiology Office Note:    Date:  12/30/2022   ID:  Kerri Sosa, Kerri Sosa 03-19-1931, MRN [id number]  PCP:  Richmond Campbell., PA-C   [facility] Providers Cardiologist:  Kristeen Miss, MD     Referring MD: Richmond Campbell., PA-C   Chief Complaint: new onset atrial fibrillation  History of Present Illness:    Kerri Sosa is a very pleasant 89 y.o. female with a hx of aortic stenosis, mitral regurgitation hypertension, CAD s/p inferior STEMI s/p BMS mid RCA 2014, and GERD.   She established care with Dr. Elease Hashimoto in 2013 for valve disease.  In 2014 she was out of town and had an inferior wall ST segment elevation myocardial infarction in [facility] [place].  She was transferred to [facility] in [place].  Urgent cardiac cath revealed occluded mid RCA and she received bare-metal stent.  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  Also maintains a garden.  Last cardiology clinic visit was 12/24/2021 with Dr. Elease Hashimoto. Recent echo from 2022 showed mild aortic stenosis. She had no specific cardiac symptoms and one year follow-up was recommended.  Today, she is accompanied by her son, Kerri Sosa. Reports she is overall feeling well. Has arthritis pain that somewhat limits her, last year helped Kerri Sosa put in a garden but does not think she will be able to do it this year. Her knees and her back hurt. Rare episodes of chest pain, took one sl ntg in the past 6 months which did relieve discomfort. Has not had any recent feelings of chest discomfort, fatigue, palpitations, shortness of breath, or other concerning symptoms. Was not aware that her heart rate was irregular. Has mild bilateral LE edema on occasion, not present today.  She denies lightheadedness, presyncope, syncope, orthopnea, PND.   Past Medical History:  Diagnosis Date   Asthma    Crohn's disease (HCC)    Heart attack (HCC)    Hiatal hernia    Hypertension     Past Surgical History:  Procedure  Laterality Date   ABDOMINAL HYSTERECTOMY     APPENDECTOMY     HERNIA REPAIR      Current Medications: Current Meds  Medication Sig   apixaban (ELIQUIS) 5 MG TABS tablet Take 1 tablet (5 mg total) by mouth 2 (two) times daily.   bisoprolol (ZEBETA) 5 MG tablet Take 1 tablet (5 mg total) by mouth daily.   budesonide-formoterol (SYMBICORT) 80-4.5 MCG/ACT inhaler Inhale 2 puffs into the lungs daily in the afternoon.   famotidine (PEPCID) 20 MG tablet Take 20 mg by mouth at bedtime.   hydrochlorothiazide (HYDRODIURIL) 25 MG tablet Take 1 tablet (25 mg total) by mouth daily.   ibuprofen (ADVIL,MOTRIN) 200 MG tablet Take 200 mg by mouth every 6 (six) hours as needed.   loratadine (CLARITIN) 10 MG tablet Take 10 mg by mouth daily.   nitroGLYCERIN (NITROSTAT) 0.4 MG SL tablet Place 1 tablet (0.4 mg total) under the tongue every 5 (five) minutes as needed for chest pain. Max three doses.   pantoprazole (PROTONIX) 40 MG tablet Take 1 tablet (40 mg total) by mouth daily before breakfast.   potassium chloride SA (KLOR-CON M) 20 MEQ tablet TAKE ONE TABLET BY MOUTH EVERY DAY   pravastatin (PRAVACHOL) 40 MG tablet Take 1 tablet by mouth as directed. 3x a week, M,W,F only   [DISCONTINUED] amLODipine-valsartan (EXFORGE) 5-160 MG tablet Take 1 tablet by mouth daily. Pt. Must make an appointment  in order to receive future refills. First attempt.   [DISCONTINUED] aspirin EC 81 MG tablet Take 81 mg by mouth daily.   [DISCONTINUED] clopidogrel (PLAVIX) 75 MG tablet TAKE ONE TABLET BY MOUTH DAILY     Allergies:   Lipitor [atorvastatin]   Social History   Socioeconomic History   Marital status: Widowed    Spouse name: Not on file   Number of children: 7   Years of education: Not on file   Highest education level: Not on file  Occupational History   Occupation: Retired  Tobacco Use   Smoking status: Never   Smokeless tobacco: Never  Vaping Use   Vaping Use: Never used  Substance and Sexual Activity    Alcohol use: No   Drug use: No   Sexual activity: Not on file  Other Topics Concern   Not on file  Social History Narrative   Not on file   Social Determinants of Health   Financial Resource Strain: Not on file  Food Insecurity: Not on file  Transportation Needs: Not on file  Physical Activity: Not on file  Stress: Not on file  Social Connections: Not on file     Family History: The patient's family history includes Breast cancer in her sister; Cancer in her father; Heart disease in her mother; Hypertension in her daughter. There is no history of Colon cancer, Stomach cancer, Esophageal cancer, or Colon polyps.  ROS:   Please see the history of present illness.   All other systems reviewed and are negative.  Labs/Other Studies Reviewed:    The following studies were reviewed today:  Echo 12/01/20 1. The /aortic valve is tricuspid. There is mild calcification of the  aortic valve. There is mild thickening of the aortic valve. Aortic valve  regurgitation is not visualized. Mild aortic valve stenosis. Aortic valve  mean gradient measures 18.0 mmHg.  Aortic valve Vmax measures 2.81 m/s. Aortic valve acceleration time  measures 91 msec.   2. Left ventricular ejection fraction, by estimation, is 60 to 65%. The  left ventricle has normal function. The left ventricle has no regional  wall motion abnormalities. There is mild concentric left ventricular  hypertrophy. Left ventricular diastolic  parameters are consistent with Grade II diastolic dysfunction  (pseudonormalization).   3. Right ventricular systolic function is normal. The right ventricular  size is normal. Tricuspid regurgitation signal is inadequate for assessing  PA pressure.   4. Left atrial size was severely dilated.   5. The mitral valve is grossly normal. Mild mitral valve regurgitation.  No evidence of mitral stenosis.   6. The inferior vena cava is dilated in size with >50% respiratory  variability,  suggesting right atrial pressure of 8 mmHg.   Comparison(s): A prior study was performed on 06/05/2015. Prior images  reviewed side by side. Aortic Valve morphology better seen, slight  increase in gradient.    Recent Labs: No results found for requested labs within last 365 days.  Recent Lipid Panel    Component Value Date/Time   CHOL 179 10/29/2019 0856   TRIG 77 10/29/2019 0856   HDL 78 10/29/2019 0856   CHOLHDL 2.3 10/29/2019 0856   CHOLHDL 1.8 11/29/2015 0842   VLDL 11 11/29/2015 0842   LDLCALC 87 10/29/2019 0856     Risk Assessment/Calculations:    CHA2DS2-VASc Score = 5   This indicates a 7.2% annual risk of stroke. The patient's score is based upon: CHF History: 0 HTN History: 1 Diabetes History: 0 Stroke  History: 0 Vascular Disease History: 1 Age Score: 2 Gender Score: 1          Physical Exam:    VS:  BP 116/68   Pulse 75   Ht 5\' 2"  (1.575 m)   Wt 156 lb 9.6 oz (71 kg)   SpO2 95%   BMI 28.64 kg/m     Wt Readings from Last 3 Encounters:  12/30/22 156 lb 9.6 oz (71 kg)  12/24/21 156 lb 3.2 oz (70.9 kg)  11/12/21 158 lb 3.2 oz (71.8 kg)     GEN:  Well nourished, well developed in no acute distress HEENT: Normal NECK: No JVD; No carotid bruits CARDIAC: Irregular RR, 2/6 systolic murmur. No rubs, gallops RESPIRATORY:  Clear to auscultation without rales, wheezing or rhonchi  ABDOMEN: Soft, non-tender, non-distended MUSCULOSKELETAL:  No edema; No deformity. 2+ pedal pulses, equal bilaterally SKIN: Warm and dry NEUROLOGIC:  Alert and oriented x 3 PSYCHIATRIC:  Normal affect   EKG:  EKG is ordered today.  The ekg ordered today demonstrates atrial fibrillation at 75 bpm, no acute ST abnormality       Diagnoses:    1. Coronary artery disease involving native coronary artery of native heart without angina pectoris   2. New onset atrial fibrillation (HCC)   3. Nonrheumatic aortic valve stenosis   4. Dyslipidemia   5. Essential hypertension     Assessment and Plan:     New onset atrial fibrillation: Atrial fibrillation with well controlled ventricular rate at 75 bpm noted on EKG today. She is asymptomatic. Reports echo sonographer mentioned a fib to her in the past, but no further details and no documentation in our records. Lengthy discussion about management of a fib with patient and her son including AAD, DCCV, and ablation. Severe LAE on echo 11/2020. Advised it will likely be difficult to maintain NSR. She would like to maintain rate control with bisoprolol at this time and have soon follow-up appointment for further discussion. Reviewed plan with Dr. Lynnette Caffey, DOD who agrees that we stop Plavix and aspirin and start Eliquis 5 mg BID (appropriate dose wt/creatinine/age) for stroke prevention for CHA2DS2-VASc score of 5. Will plan to recheck CBC and BMET in one month.   CAD without angina: History of BMS to [facility] s/p inferior STEMI in 2014 in [place], [place]. She denies chest pain, dyspnea, or other symptoms concerning for angina. No indication for further ischemic evaluation at this time. Has continued DAPT on clopidogrel and aspirin since that time. Will d/c clopidogrel and aspirin and start Eliquis as noted above. No bleeding concerns to report. Brief discussion about LDL goal 55 or lower. LDL was 67 on 10/24/22. Advised that she consider switching pravastatin to atorvastatin or rosuvastatin and to avoid saturated fat and processed foods as much as possible.  No changes to statin therapy made today. Continue GDMT including amlodipine-valsartan, pravastatin, bisoprolol.   Hyperlipidemia LDL goal < 55: LDL 67 on 10/24/22. As noted above, brief discussion about getting LDL to goal. I offered to switch to higher potency statin but she seems hesitant. Emphasized the importance of mostly plant based diet, avoiding saturated fat. Consider further discussion at next office visit.   Hypertension: BP is well controlled.   Aortic stenosis: Mild aortic  valve stenosis with MG measuring 18.0 mmHg on echo 11/2020. She is asymptomatic. Consider repeat echo at next office visit.      Disposition: 3 months with Dr. Elease Hashimoto or me  Medication Adjustments/Labs and Tests Ordered: Current medicines  are reviewed at length with the patient today.  Concerns regarding medicines are outlined above.  Orders Placed This Encounter  Procedures   EKG 12-Lead   Meds ordered this encounter  Medications   amLODipine-valsartan (EXFORGE) 5-160 MG tablet    Sig: Take 1 tablet by mouth daily. .    Dispense:  90 tablet    Refill:  3    This prescription was filled on 12/24/2022. Any refills authorized will be placed on file.   apixaban (ELIQUIS) 5 MG TABS tablet    Sig: Take 1 tablet (5 mg total) by mouth 2 (two) times daily.    Dispense:  60 tablet    Refill:  6    Patient Instructions  Medication Instructions:   DISCONTINUE PLAVIX.  DISCONTINUE ASPRIN.  START Eliquis one (1) tablet by mouth ( 5 mg) twice daily.   *If you need a refill on your cardiac medications before your next appointment, please call your pharmacy*   Lab Work:   None ordered.   If you have labs (blood work) drawn today and your tests are completely normal, you will receive your results only by: MyChart Message (if you have MyChart) OR A paper copy in the mail If you have any lab test that is abnormal or we need to change your treatment, we will call you to review the results.   Testing/Procedures:  None ordered.   Follow-Up: At [facility], you and your health needs are our priority.  As part of our continuing mission to provide you with exceptional heart care, we have created designated Provider Care Teams.  These Care Teams include your primary Cardiologist (physician) and Advanced Practice Providers (APPs -  Physician Assistants and Nurse Practitioners) who all work together to provide you with the care you need, when you need it.  We recommend signing up  for the patient portal called "MyChart".  Sign up information is provided on this After Visit Summary.  MyChart is used to connect with patients for Virtual Visits (Telemedicine).  Patients are able to view lab/test results, encounter notes, upcoming appointments, etc.  Non-urgent messages can be sent to your provider as well.   To learn more about what you can do with MyChart, go to [email or web address].    Your next appointment:   3 month(s)  Provider:   Kristeen Miss, MD       Signed, Levi Aland, NP  12/30/2022 11:16 AM    [facility]

## 2022-12-24 ENCOUNTER — Other Ambulatory Visit: Payer: Self-pay | Admitting: Cardiovascular Disease

## 2022-12-30 ENCOUNTER — Encounter: Payer: Self-pay | Admitting: Nurse Practitioner

## 2022-12-30 ENCOUNTER — Ambulatory Visit: Payer: Medicare Other | Attending: Nurse Practitioner | Admitting: Nurse Practitioner

## 2022-12-30 ENCOUNTER — Other Ambulatory Visit: Payer: Self-pay | Admitting: *Deleted

## 2022-12-30 VITALS — BP 116/68 | HR 75 | Ht 62.0 in | Wt 156.6 lb

## 2022-12-30 DIAGNOSIS — I350 Nonrheumatic aortic (valve) stenosis: Secondary | ICD-10-CM | POA: Insufficient documentation

## 2022-12-30 DIAGNOSIS — I2510 Atherosclerotic heart disease of native coronary artery without angina pectoris: Secondary | ICD-10-CM | POA: Diagnosis present

## 2022-12-30 DIAGNOSIS — I10 Essential (primary) hypertension: Secondary | ICD-10-CM | POA: Diagnosis present

## 2022-12-30 DIAGNOSIS — E785 Hyperlipidemia, unspecified: Secondary | ICD-10-CM | POA: Insufficient documentation

## 2022-12-30 DIAGNOSIS — I4891 Unspecified atrial fibrillation: Secondary | ICD-10-CM | POA: Diagnosis present

## 2022-12-30 DIAGNOSIS — Z79899 Other long term (current) drug therapy: Secondary | ICD-10-CM | POA: Insufficient documentation

## 2022-12-30 MED ORDER — APIXABAN 5 MG PO TABS: 5.0000 mg | ORAL_TABLET | Freq: Two times a day (BID) | ORAL | 0 refills | Status: DC

## 2022-12-30 MED ORDER — APIXABAN 5 MG PO TABS: 5.0000 mg | ORAL_TABLET | Freq: Two times a day (BID) | ORAL | 6 refills | Status: DC

## 2022-12-30 MED ORDER — AMLODIPINE BESYLATE-VALSARTAN 5-160 MG PO TABS: 1.0000 | ORAL_TABLET | Freq: Every day | ORAL | 3 refills | Status: DC

## 2022-12-30 NOTE — Patient Instructions (Signed)
Medication Instructions:   DISCONTINUE PLAVIX.  DISCONTINUE ASPRIN.  START Eliquis one (1) tablet by mouth ( 5 mg) twice daily.   *If you need a refill on your cardiac medications before your next appointment, please call your pharmacy*   Lab Work:   None ordered.   If you have labs (blood work) drawn today and your tests are completely normal, you will receive your results only by: MyChart Message (if you have MyChart) OR A paper copy in the mail If you have any lab test that is abnormal or we need to change your treatment, we will call you to review the results.   Testing/Procedures:  None ordered.   Follow-Up: At [facility], you and your health needs are our priority.  As part of our continuing mission to provide you with exceptional heart care, we have created designated Provider Care Teams.  These Care Teams include your primary Cardiologist (physician) and Advanced Practice Providers (APPs -  Physician Assistants and Nurse Practitioners) who all work together to provide you with the care you need, when you need it.  We recommend signing up for the patient portal called "MyChart".  Sign up information is provided on this After Visit Summary.  MyChart is used to connect with patients for Virtual Visits (Telemedicine).  Patients are able to view lab/test results, encounter notes, upcoming appointments, etc.  Non-urgent messages can be sent to your provider as well.   To learn more about what you can do with MyChart, go to [email or web address].    Your next appointment:   3 month(s)  Provider:   Kristeen Miss, MD

## 2023-01-22 ENCOUNTER — Other Ambulatory Visit: Payer: Self-pay | Admitting: Cardiovascular Disease

## 2023-01-29 ENCOUNTER — Ambulatory Visit: Payer: Medicare Other | Attending: Nurse Practitioner

## 2023-01-29 DIAGNOSIS — I10 Essential (primary) hypertension: Secondary | ICD-10-CM

## 2023-01-29 DIAGNOSIS — E785 Hyperlipidemia, unspecified: Secondary | ICD-10-CM

## 2023-01-29 DIAGNOSIS — I350 Nonrheumatic aortic (valve) stenosis: Secondary | ICD-10-CM

## 2023-01-29 DIAGNOSIS — I4891 Unspecified atrial fibrillation: Secondary | ICD-10-CM

## 2023-01-29 DIAGNOSIS — I2510 Atherosclerotic heart disease of native coronary artery without angina pectoris: Secondary | ICD-10-CM

## 2023-01-29 DIAGNOSIS — Z79899 Other long term (current) drug therapy: Secondary | ICD-10-CM

## 2023-01-30 LAB — BASIC METABOLIC PANEL
BUN/Creatinine Ratio: 18 (ref 12–28)
BUN: 23 mg/dL (ref 8–27)
CO2: 25 mmol/L (ref 20–29)
Calcium: 9.3 mg/dL (ref 8.7–10.3)
Chloride: 106 mmol/L (ref 96–106)
Creatinine, Ser: 1.31 mg/dL — ABNORMAL HIGH (ref 0.57–1.00)
Glucose: 94 mg/dL (ref 70–99)
Potassium: 4.7 mmol/L (ref 3.5–5.2)
Sodium: 144 mmol/L (ref 134–144)
eGFR: 39 mL/min/{1.73_m2} — ABNORMAL LOW (ref >59)

## 2023-01-30 LAB — CBC
Hematocrit: 38.9 % (ref 34.0–46.6)
Hemoglobin: 13 g/dL (ref 11.1–15.9)
MCH: 29.2 pg (ref 26.6–33.0)
MCHC: 33.4 g/dL (ref 31.5–35.7)
MCV: 87 fL (ref 79–97)
Platelets: 213 10*3/uL (ref 150–450)
RBC: 4.45 x10E6/uL (ref 3.77–5.28)
RDW: 13 % (ref 11.7–15.4)
WBC: 7.3 10*3/uL (ref 3.4–10.8)

## 2023-02-04 ENCOUNTER — Other Ambulatory Visit: Payer: Medicare Other

## 2023-02-18 ENCOUNTER — Other Ambulatory Visit: Payer: Self-pay | Admitting: Cardiovascular Disease

## 2023-03-18 ENCOUNTER — Encounter: Payer: Self-pay | Admitting: Cardiovascular Disease

## 2023-03-18 NOTE — Progress Notes (Signed)
Cardiology Office Note   Date:  03/19/2023   ID:  Jaleyza, Hugues 1931/03/05, MRN [id number]  PCP:  Richmond Campbell., PA-C  Cardiologist:   Kristeen Miss, MD   Chief Complaint  Patient presents with   Coronary Artery Disease        1. Dyspnea 2. Aortic stenosis - mild , for cardiac catheterization in August, 2014 revealed a peak to peak aortic valve gradient of 7 mmHg. In the past her Aortic stenosis has been graded as mild using echo. 3. Mitral regurgitation 4. CAD - s/p Inf. STEMI, s/p BMS stenting of her mid RCA. 4.0 stent dilated to 5.0 mm. ( Aug. 8, 2014), [place], [place]  5. Hypertension   Ms. Savacool is a 89 yo with hx of mild AS and MR.  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  She does not get any regular exercise.   She denies any syncopal episodes. She denies any chest pain.  Sept. 24, 2014:  Dajanai was out of town and had an inferior wall ST segment elevation myocardial infarction. She was on vacation at  [facility] and started having episodes of midsternal pain.   She was transferred to [facility] and taken to [facility]. She had an urgent cardiac catheterization which revealed an occluded mid right coronary artery.   She had a 4.0 x 20 mm bare-metal stent placed in her mid right coronary artery. He was post dilated up to 5.0 mm with a noncompliant balloon.  She is doing well at this point.   No CP.  She still has some dyspnea.   She's eager to get back to work. She's not had any   severe complications.  She measures her blood pressure on regular basis and her readings are typically in the normal range.  Dec. 4, 2014:  No cardiac symptoms.  Has had a head cold.    December 14, 2013:  Hanadi is doing well.   She is going to visit her daughter at [facility] next week. No angina pain. 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   No CP, dyspnea, or presyncope.  Nov. 17 ,2015:  Ysamar is a  89 y.o. female who I follow for her CAD ( MI while in [place], [place] Aug. 2014) and her aortic stenosis.   Riko is doing well.  Still works at her church.  Goes up and down stairs without problems. Occasionally out of breath at the top of the stairs.   No Cp , no dizziness.    BP has been normal at home.   A bit elevated today.    Nov 28 2014:   CHAMPAGNE FAUDREE is a 89 y.o. female who presents for follow up of CAD No cp BP is a bit elevated at home. BP at home is normal .     Nov. 14, 2016:  Able to do all of her normal activities. Just got back from fishing at the beach.    No CP or dyspnea.  BP is here in the office.  Was normal at home .  She watches her salt .   May 17 ,2017:  Doing well . No CP , no dyspnea.  Able to do all of her normal activities  BP is a bit high   Nov. 15, 2017:  Doing well.   Is short of breath.   Has DOE  She cleans the church every week.   She hauls  wood in the winter   January 16, 2017:  Ms. Daponte is seen today for follow up of her CAD . Has very mild aortic stenosis.   Feb. 12, 2019:  Doing well. Staying active.  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  Sept. 4, 2019:  Still active. 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 Has some chest heaviness, occurs after eating .  Not necessarliy associated with exertion Recently had an episode that lasted all day  Does not take NTG or antiacid  Had an MI in [place] - had a stent placed BP is a bit high today  - has not had her meds today   October 29, 2019: Mrs. Alicea is seen today for follow-up of her coronary artery disease.  She had a myocardial infarction in 2014.  She is status post stenting of her RCA. She has a history of hypertension and hyperlipidemia.  BP is a bit elevated this am.   Has been normal when she checks at home  Chronic dyspnea.   No cp   October 30, 2020:  Ms. Downs is seen for follow up .  Hx of CAD, hx of MI in 2014.  Hx of stenting of her RCA , htn and  HLD   Labs drawn at [facility] office shows a LDL of 74.  The total cholesterol is 161.  Triglyceride level 74.  HDL is 67.  December 24, 2021 Geneiveve is seen today for follow up of her CAD ( hx of MI in 2014) Hx of HLD, HTN, mild AS on exam and echo  No cardiac issues Had some RUQ pain the other day .  Thought it was indigestion    Sept. 4, 2024 Ms.Kouris is seen for follow up of her CAD Has arthritis like pain  Heart is doing well  Able to do most of her chores Difficult to work in the garden now .  Marcelino Duster put her on a strict diet        Past Medical History:  Diagnosis Date   Asthma    Crohn's disease (HCC)    Heart attack (HCC)    Hiatal hernia    Hypertension     Past Surgical History:  Procedure Laterality Date   ABDOMINAL HYSTERECTOMY     APPENDECTOMY     HERNIA REPAIR       Current Outpatient Medications  Medication Sig Dispense Refill   amLODipine-valsartan (EXFORGE) 5-160 MG tablet Take 1 tablet by mouth daily. . 90 tablet 3   apixaban (ELIQUIS) 5 MG TABS tablet Take 1 tablet (5 mg total) by mouth 2 (two) times daily. 60 tablet 6   bisoprolol (ZEBETA) 5 MG tablet Take 1 tablet (5 mg total) by mouth daily. 30 tablet 11   budesonide-formoterol (SYMBICORT) 80-4.5 MCG/ACT inhaler Inhale 2 puffs into the lungs daily in the afternoon.     famotidine (PEPCID) 20 MG tablet Take 20 mg by mouth at bedtime.     hydrochlorothiazide (HYDRODIURIL) 25 MG tablet Take 1 tablet (25 mg total) by mouth daily. 30 tablet 2   ibuprofen (ADVIL,MOTRIN) 200 MG tablet Take 200 mg by mouth every 6 (six) hours as needed.     loratadine (CLARITIN) 10 MG tablet Take 10 mg by mouth daily.     nitroGLYCERIN (NITROSTAT) 0.4 MG SL tablet Place 1 tablet (0.4 mg total) under the tongue every 5 (five) minutes as needed for chest  pain. Max three doses. 25 tablet 11   pantoprazole (PROTONIX) 40 MG tablet Take 1 tablet (40 mg total) by mouth daily before breakfast. 30 tablet 2   potassium  chloride SA (KLOR-CON M) 20 MEQ tablet TAKE ONE TABLET BY MOUTH EVERY DAY 90 tablet 1   pravastatin (PRAVACHOL) 40 MG tablet Take 1 tablet by mouth as directed. 3x a week, M,W,F only     No current facility-administered medications for this visit.    Allergies:   Lipitor [atorvastatin]    Social History:  The patient  reports that she has never smoked. She has never used smokeless tobacco. She reports that she does not drink alcohol and does not use drugs.   Family History:  The patient's family history includes Breast cancer in her sister; Cancer in her father; Heart disease in her mother; Hypertension in her daughter.    ROS:   Noted in current history, all other systems are negative.  \ Physical Exam: Blood pressure 112/82, pulse 85, height 5\' 2"  (1.575 m), weight 155 lb 9.6 oz (70.6 kg), SpO2 96%.       GEN:  elderly female,   in no acute distress HEENT: Normal NECK: No JVD; No carotid bruits LYMPHATICS: No lymphadenopathy CARDIAC: RRR , systolic murmur RESPIRATORY:  Clear to auscultation without rales, wheezing or rhonchi  ABDOMEN: Soft, non-tender, non-distended MUSCULOSKELETAL:   trace - 1+ leg edema .  No deformity  SKIN: Warm and dry NEUROLOGIC:  Alert and oriented x 3   EKG:       Recent Labs: 01/29/2023: BUN 23; Creatinine, Ser 1.31; Hemoglobin 13.0; Platelets 213; Potassium 4.7; Sodium 144    Lipid Panel    Component Value Date/Time   CHOL 179 10/29/2019 0856   TRIG 77 10/29/2019 0856   HDL 78 10/29/2019 0856   CHOLHDL 2.3 10/29/2019 0856   CHOLHDL 1.8 11/29/2015 0842   VLDL 11 11/29/2015 0842   LDLCALC 87 10/29/2019 0856      Wt Readings from Last 3 Encounters:  03/19/23 155 lb 9.6 oz (70.6 kg)  12/30/22 156 lb 9.6 oz (71 kg)  12/24/21 156 lb 3.2 oz (70.9 kg)      Other studies Reviewed: Additional studies/ records that were reviewed today include: . Review of the above records demonstrates:    ASSESSMENT AND PLAN:  1. Dyspnea-        stable   2. Aortic stenosis  - stable   3. Mitral regurgitation-    4. CAD - s/p Inf. STEMI, s/p BMS stenting of her mid RCA. 4.0 stent dilated to 5.0 mm. ( Aug. 8, 2014).    Denies any angina     5.  Hypertension:   BP is well controlled.      Current medicines are reviewed at length with the patient today.  The patient does not have concerns regarding medicines.  The following changes have been made:  no change  Labs/ tests ordered today include:   No orders of the defined types were placed in this encounter.   Disposition:     Kristeen Miss, MD  03/19/2023 10:15 AM    [facility] [address] [place], [place], [place]  [address] Phone: [phone]; Fax: [phone]

## 2023-03-19 ENCOUNTER — Ambulatory Visit: Payer: Medicare Other | Attending: Cardiovascular Disease | Admitting: Cardiovascular Disease

## 2023-03-19 ENCOUNTER — Encounter: Payer: Self-pay | Admitting: Cardiovascular Disease

## 2023-03-19 VITALS — BP 112/82 | HR 85 | Ht 62.0 in | Wt 155.6 lb

## 2023-03-19 DIAGNOSIS — I10 Essential (primary) hypertension: Secondary | ICD-10-CM | POA: Diagnosis present

## 2023-03-19 DIAGNOSIS — I2510 Atherosclerotic heart disease of native coronary artery without angina pectoris: Secondary | ICD-10-CM | POA: Diagnosis present

## 2023-03-19 DIAGNOSIS — E785 Hyperlipidemia, unspecified: Secondary | ICD-10-CM | POA: Diagnosis present

## 2023-03-19 MED ORDER — EZETIMIBE 10 MG PO TABS: 10.0000 mg | ORAL_TABLET | Freq: Every day | ORAL | 3 refills | Status: DC

## 2023-03-19 NOTE — Patient Instructions (Signed)
Medication Instructions:  START Zetia/Ezetimibe 10mg  daily *If you need a refill on your cardiac medications before your next appointment, please call your pharmacy*   Lab Work: BMET, ALT, Lipids in 3 months If you have labs (blood work) drawn today and your tests are completely normal, you will receive your results only by: MyChart Message (if you have MyChart) OR A paper copy in the mail If you have any lab test that is abnormal or we need to change your treatment, we will call you to review the results.   Testing/Procedures: NONE   Follow-Up: At [facility], you and your health needs are our priority.  As part of our continuing mission to provide you with exceptional heart care, we have created designated Provider Care Teams.  These Care Teams include your primary Cardiologist (physician) and Advanced Practice Providers (APPs -  Physician Assistants and Nurse Practitioners) who all work together to provide you with the care you need, when you need it.  We recommend signing up for the patient portal called "MyChart".  Sign up information is provided on this After Visit Summary.  MyChart is used to connect with patients for Virtual Visits (Telemedicine).  Patients are able to view lab/test results, encounter notes, upcoming appointments, etc.  Non-urgent messages can be sent to your provider as well.   To learn more about what you can do with MyChart, go to [email or web address].    Your next appointment:   6 month(s)  Provider:   Eligha Bridegroom, NP

## 2023-03-25 ENCOUNTER — Other Ambulatory Visit: Payer: Self-pay | Admitting: Cardiovascular Disease

## 2023-05-30 ENCOUNTER — Other Ambulatory Visit: Payer: Self-pay | Admitting: Cardiovascular Disease

## 2023-06-27 ENCOUNTER — Other Ambulatory Visit: Payer: Self-pay | Admitting: Cardiovascular Disease

## 2023-06-27 ENCOUNTER — Ambulatory Visit: Payer: Medicare Other

## 2023-06-28 LAB — LIPID PANEL
Chol/HDL Ratio: 2.2 {ratio} (ref 0.0–4.4)
Cholesterol, Total: 156 mg/dL (ref 100–199)
HDL: 71 mg/dL (ref >39)
LDL Chol Calc (NIH): 70 mg/dL (ref 0–99)
Triglycerides: 80 mg/dL (ref 0–149)
VLDL Cholesterol Cal: 15 mg/dL (ref 5–40)

## 2023-06-28 LAB — BASIC METABOLIC PANEL
BUN/Creatinine Ratio: 16 (ref 12–28)
BUN: 24 mg/dL (ref 10–36)
CO2: 27 mmol/L (ref 20–29)
Calcium: 9.2 mg/dL (ref 8.7–10.3)
Chloride: 105 mmol/L (ref 96–106)
Creatinine, Ser: 1.54 mg/dL — ABNORMAL HIGH (ref 0.57–1.00)
Glucose: 94 mg/dL (ref 70–99)
Potassium: 4.3 mmol/L (ref 3.5–5.2)
Sodium: 143 mmol/L (ref 134–144)
eGFR: 32 mL/min/{1.73_m2} — ABNORMAL LOW (ref >59)

## 2023-06-28 LAB — ALT: ALT: 13 [IU]/L (ref 0–32)

## 2023-08-21 ENCOUNTER — Other Ambulatory Visit: Payer: Self-pay | Admitting: Nurse Practitioner

## 2023-08-21 NOTE — Telephone Encounter (Signed)
 Prescription refill request for Eliquis  received.  Indication: afib Last office visit: 03/19/2023 Scr: 1.54, 06/28/2023 Age: 89 yo  Weight: 70.6 kg   Per dosing criteria pt qualifies for a dose decrease

## 2023-08-26 ENCOUNTER — Telehealth: Payer: Self-pay | Admitting: Cardiovascular Disease

## 2023-08-26 MED ORDER — APIXABAN 5 MG PO TABS: 5.0000 mg | ORAL_TABLET | Freq: Two times a day (BID) | ORAL | 1 refills | Status: DC

## 2023-08-26 NOTE — Telephone Encounter (Signed)
Prescription refill request for Eliquis received. Indication: AF Last office visit: 03/19/23  Becky Augusta MD Scr: 1.54 on 06/27/23  Epic Age: 89 Weight: 70.6kg  Per lab work Scr slightly elevated for first time.  Continue current meds per Dr Melburn Popper.  Monitor Scr closely. Eliquis 5mg  twice daily.

## 2023-08-26 NOTE — Telephone Encounter (Signed)
*  STAT* If patient is at the pharmacy, call can be transferred to refill team.   1. Which medications need to be refilled? (please list name of each medication and dose if known)   apixaban (ELIQUIS) 5 MG TABS tablet      4. Which pharmacy/location (including street and city if local pharmacy) is medication to be sent to? [id number] - [place], [place] - [address]     5. Do they need a 30 day or 90 day supply? 90

## 2023-11-05 ENCOUNTER — Encounter: Payer: Self-pay | Admitting: Cardiovascular Disease

## 2023-11-05 NOTE — Progress Notes (Unsigned)
 Cardiology Office Note   Date:  11/06/2023   ID:  Kerri Sosa, Kerri Sosa Mar 26, 1931, MRN [id number]  PCP:  Lory Rough., PA-C  Cardiologist:   Ahmad Alert, MD   Chief Complaint  Patient presents with   Coronary Artery Disease        Aortic Stenosis   1. Dyspnea 2. Aortic stenosis - mild , for cardiac catheterization in August, 2014 revealed a peak to peak aortic valve gradient of 7 mmHg. In the past her Aortic stenosis has been graded as mild using echo. 3. Mitral regurgitation 4. CAD - s/p Inf. STEMI, s/p BMS stenting of her mid RCA. 4.0 stent dilated to 5.0 mm. ( Aug. 8, 2014), [place], [place]  5. Hypertension   Kerri Sosa is a 89 yo with hx of mild AS and MR.  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  She does not get any regular exercise.   She denies any syncopal episodes. She denies any chest pain.  Sept. 24, 2014:  Kerri Sosa was out of town and had an inferior wall ST segment elevation myocardial infarction. She was on vacation at  [facility] [place]  and started having episodes of midsternal pain.   She was transferred to [facility] and taken to [facility]. She had an urgent cardiac catheterization which revealed an occluded mid right coronary artery.   She had a 4.0 x 20 mm bare-metal stent placed in her mid right coronary artery. He was post dilated up to 5.0 mm with a noncompliant balloon.  She is doing well at this point.   No CP.  She still has some dyspnea.   She's eager to get back to work. She's not had any   severe complications.  She measures her blood pressure on regular basis and her readings are typically in the normal range.  Dec. 4, 2014:  No cardiac symptoms.  Has had a head cold.    December 14, 2013:  Kerri Sosa is doing well.   She is going to visit her daughter at [facility] next week. No angina pain. 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   No CP, dyspnea, or presyncope.  Nov.  17 ,2015:  Kerri Sosa is a 89 y.o. female who I follow for her CAD ( MI while in [place], [place] Aug. 2014) and her aortic stenosis.   [place]ora is doing well.  Still works at her church.  Goes up and down stairs without problems. Occasionally out of breath at the top of the stairs.   No Cp , no dizziness.    BP has been normal at home.   A bit elevated today.    Nov 28 2014:   Kerri Sosa is a 89 y.o. female who presents for follow up of CAD No cp BP is a bit elevated at home. BP at home is normal .     Nov. 14, 2016:  Able to do all of her normal activities. Just got back from fishing at the beach.    No CP or dyspnea.  BP is here in the office.  Was normal at home .  She watches her salt .   May 17 ,2017:  Doing well . No CP , no dyspnea.  Able to do all of her normal activities  BP is a bit high   Nov. 15, 2017:  Doing well.   Is short of breath.  Has DOE  She cleans the church every week.   She hauls  wood in the winter   January 16, 2017:  Kerri Sosa is seen today for follow up of her CAD . Has very mild aortic stenosis.   Feb. 12, 2019:  Doing well. Staying active.  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  Sept. 4, 2019:  Still active. 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 Has some chest heaviness, occurs after eating .  Not necessarliy associated with exertion Recently had an episode that lasted all day  Does not take NTG or antiacid  Had an MI in [place] - had a stent placed BP is a bit high today  - has not had her meds today   October 29, 2019: Kerri Sosa is seen today for follow-up of her coronary artery disease.  She had a myocardial infarction in 2014.  She is status post stenting of her RCA. She has a history of hypertension and hyperlipidemia.  BP is a bit elevated this am.   Has been normal when she checks at home  Chronic dyspnea.   No cp   October 30, 2020:  Kerri Sosa is seen for follow up .  Hx of CAD, hx of MI in 2014.  Hx of stenting  of her RCA , htn and HLD   Labs drawn at [facility] office shows a LDL of 74.  The total cholesterol is 161.  Triglyceride level 74.  HDL is 67.  December 24, 2021 Kerri Sosa is seen today for follow up of her CAD ( hx of MI in 2014) Hx of HLD, HTN, mild AS on exam and echo  No cardiac issues Had some RUQ pain the other day .  Thought it was indigestion    Sept. 4, 2024 Kerri Sosa is seen for follow up of her CAD Has arthritis like pain  Heart is doing well  Able to do most of her chores Difficult to work in the garden now .  Kerri Sosa put her on a strict diet   April, 24, 2025 Kerri Sosa is seen for follow up of her CAD she has a history of myocardial infarction in 2014 with stenting of the right coronary artery. Hx of atrial fib  No CP , no dyspnea   She is in atrial fib  today        Past Medical History:  Diagnosis Date   Asthma    Crohn's disease (HCC)    Heart attack (HCC)    Hiatal hernia    Hypertension     Past Surgical History:  Procedure Laterality Date   ABDOMINAL HYSTERECTOMY     APPENDECTOMY     HERNIA REPAIR       Current Outpatient Medications  Medication Sig Dispense Refill   amLODipine -valsartan  (EXFORGE ) 5-160 MG tablet Take 1 tablet by mouth daily. . 90 tablet 3   apixaban  (ELIQUIS ) 5 MG TABS tablet Take 1 tablet (5 mg total) by mouth 2 (two) times daily. 180 tablet 1   bisoprolol  (ZEBETA ) 5 MG tablet Take 1 tablet (5 mg total) by mouth daily. 30 tablet 11   budesonide -formoterol  (SYMBICORT ) 80-4.5 MCG/ACT inhaler Inhale 2 puffs into the lungs daily in the afternoon.     ezetimibe  (ZETIA ) 10 MG tablet Take 1 tablet (10 mg total) by mouth daily. 90 tablet 3   famotidine (PEPCID) 20 MG tablet Take 20 mg by mouth at bedtime.     hydrochlorothiazide  (HYDRODIURIL ) 25 MG tablet  Take 1 tablet (25 mg total) by mouth daily. 90 tablet 3   ibuprofen (ADVIL,MOTRIN) 200 MG tablet Take 200 mg by mouth every 6 (six) hours as needed.     loratadine (CLARITIN) 10  MG tablet Take 10 mg by mouth daily.     nitroGLYCERIN  (NITROSTAT ) 0.4 MG SL tablet Place 1 tablet (0.4 mg total) under the tongue every 5 (five) minutes as needed for chest pain. Max three doses. 25 tablet 11   pantoprazole  (PROTONIX ) 40 MG tablet Take 1 tablet (40 mg total) by mouth daily before breakfast. 30 tablet 2   potassium chloride  SA (KLOR-CON  M) 20 MEQ tablet TAKE ONE TABLET BY MOUTH EVERY DAY 90 tablet 2   pravastatin  (PRAVACHOL ) 40 MG tablet Take 1 tablet by mouth as directed. 3x a week, M,W,F only     No current facility-administered medications for this visit.    Allergies:   Lipitor [atorvastatin]    Social History:  The patient  reports that she has never smoked. She has never used smokeless tobacco. She reports that she does not drink alcohol and does not use drugs.   Family History:  The patient's family history includes Breast cancer in her sister; Cancer in her father; Heart disease in her mother; Hypertension in her daughter.    ROS:   Noted in current history, all other systems are negative.   Physical Exam: There were no vitals taken for this visit.       GEN:  Well nourished, well developed in no acute distress HEENT: Normal NECK: No JVD; No carotid bruits LYMPHATICS: No lymphadenopathy CARDIAC: irreg. Irreg. , 1-2 /6  systolic murmur  RESPIRATORY:  Clear to auscultation without rales, wheezing or rhonchi  ABDOMEN: Soft, non-tender, non-distended MUSCULOSKELETAL:  No edema; No deformity  SKIN: Warm and dry NEUROLOGIC:  Alert and oriented x 3    EKG:       4./24/25:   atrial fib .  No changes from previous ECG     Recent Labs: 01/29/2023: Hemoglobin 13.0; Platelets 213 06/27/2023: ALT 13; BUN 24; Creatinine, Ser 1.54; Potassium 4.3; Sodium 143    Lipid Panel    Component Value Date/Time   CHOL 156 06/27/2023 0958   TRIG 80 06/27/2023 0958   HDL 71 06/27/2023 0958   CHOLHDL 2.2 06/27/2023 0958   CHOLHDL 1.8 11/29/2015 0842   VLDL 11  11/29/2015 0842   LDLCALC 70 06/27/2023 0958      Wt Readings from Last 3 Encounters:  11/06/23 153 lb 6.4 oz (69.6 kg)  03/19/23 155 lb 9.6 oz (70.6 kg)  12/30/22 156 lb 9.6 oz (71 kg)      Other studies Reviewed: Additional studies/ records that were reviewed today include: . Review of the above records demonstrates:    ASSESSMENT AND PLAN:  1.  Atrial fibrillation: She remains in atrial fibrillation.  Continue Eliquis .  She seems to be doing well.      2. Aortic stenosis  -she has mild aortic stenosis.  3. Mitral regurgitation-    4. CAD - s/p Inf. STEMI, s/p BMS stenting of her mid RCA. 4.0 stent dilated to 5.0 mm. ( Aug. 8, 2014).   She denies any angina.     5.  Hypertension:   Blood pressure is well-controlled.     Current medicines are reviewed at length with the patient today.  The patient does not have concerns regarding medicines.  The following changes have been made:  no change  Labs/ tests  ordered today include:   No orders of the defined types were placed in this encounter.   Disposition:     Ahmad Alert, MD  11/06/2023 9:40 AM    [facility] [address] [place], [place], [place]  [address] Phone: [phone]; Fax: [phone]

## 2023-11-06 ENCOUNTER — Encounter: Payer: Self-pay | Admitting: Cardiovascular Disease

## 2023-11-06 ENCOUNTER — Ambulatory Visit: Attending: Cardiovascular Disease | Admitting: Cardiovascular Disease

## 2023-11-06 ENCOUNTER — Ambulatory Visit: Admitting: Cardiovascular Disease

## 2023-11-06 VITALS — BP 126/80 | HR 81 | Ht 62.0 in | Wt 153.4 lb

## 2023-11-06 DIAGNOSIS — I4891 Unspecified atrial fibrillation: Secondary | ICD-10-CM | POA: Insufficient documentation

## 2023-11-06 DIAGNOSIS — I10 Essential (primary) hypertension: Secondary | ICD-10-CM

## 2023-11-06 DIAGNOSIS — I4811 Longstanding persistent atrial fibrillation: Secondary | ICD-10-CM | POA: Diagnosis present

## 2023-11-06 DIAGNOSIS — I2584 Coronary atherosclerosis due to calcified coronary lesion: Secondary | ICD-10-CM | POA: Insufficient documentation

## 2023-11-06 DIAGNOSIS — I2510 Atherosclerotic heart disease of native coronary artery without angina pectoris: Secondary | ICD-10-CM | POA: Insufficient documentation

## 2023-11-06 DIAGNOSIS — I350 Nonrheumatic aortic (valve) stenosis: Secondary | ICD-10-CM | POA: Insufficient documentation

## 2023-11-06 DIAGNOSIS — E782 Mixed hyperlipidemia: Secondary | ICD-10-CM | POA: Insufficient documentation

## 2023-11-06 NOTE — Patient Instructions (Signed)
 Medication Instructions:   *If you need a refill on your cardiac medications before your next appointment, please call your pharmacy*  Lab Work:  If you have labs (blood work) drawn today and your tests are completely normal, you will receive your results only by: MyChart Message (if you have MyChart) OR A paper copy in the mail If you have any lab test that is abnormal or we need to change your treatment, we will call you to review the results.  Testing/Procedures:   Follow-Up: At [facility], you and your health needs are our priority.  As part of our continuing mission to provide you with exceptional heart care, our providers are all part of one team.  This team includes your primary Cardiologist (physician) and Advanced Practice Providers or APPs (Physician Assistants and Nurse Practitioners) who all work together to provide you with the care you need, when you need it.  Your next appointment:   1 year(s)  Provider:   Ahmad Alert, MD     We recommend signing up for the patient portal called "MyChart".  Sign up information is provided on this After Visit Summary.  MyChart is used to connect with patients for Virtual Visits (Telemedicine).  Patients are able to view lab/test results, encounter notes, upcoming appointments, etc.  Non-urgent messages can be sent to your provider as well.   To learn more about what you can do with MyChart, go to [email or web address].   Other Instructions       1st Floor: - Lobby - Registration  - Pharmacy  - Lab - Cafe  2nd Floor: - PV Lab - Diagnostic Testing (echo, CT, nuclear med)  3rd Floor: - Vacant  4th Floor: - TCTS (cardiothoracic surgery) - AFib Clinic - Structural Heart Clinic - Vascular Surgery  - Vascular Ultrasound  5th Floor: - HeartCare Cardiology (general and EP) - Clinical Pharmacy for coumadin, hypertension, lipid, weight-loss medications, and med management appointments    Valet parking  services will be available as well.

## 2023-11-10 LAB — LAB REPORT - SCANNED

## 2023-11-16 NOTE — Progress Notes (Signed)
  Duplicate appt on same day

## 2023-11-29 ENCOUNTER — Emergency Department (HOSPITAL_COMMUNITY)
Admission: EM | Admit: 2023-11-29 | Discharge: 2023-11-29 | Disposition: A | Discharge status: Home / Self Care | Attending: Emergency Medicine | Admitting: Emergency Medicine

## 2023-11-29 ENCOUNTER — Other Ambulatory Visit: Payer: Self-pay

## 2023-11-29 ENCOUNTER — Emergency Department (HOSPITAL_COMMUNITY)

## 2023-11-29 DIAGNOSIS — W19XXXA Unspecified fall, initial encounter: Secondary | ICD-10-CM

## 2023-11-29 DIAGNOSIS — W01198A Fall on same level from slipping, tripping and stumbling with subsequent striking against other object, initial encounter: Secondary | ICD-10-CM | POA: Diagnosis not present

## 2023-11-29 DIAGNOSIS — S61411A Laceration without foreign body of right hand, initial encounter: Secondary | ICD-10-CM | POA: Diagnosis not present

## 2023-11-29 DIAGNOSIS — S060XAA Concussion with loss of consciousness status unknown, initial encounter: Secondary | ICD-10-CM | POA: Insufficient documentation

## 2023-11-29 DIAGNOSIS — S12100A Unspecified displaced fracture of second cervical vertebra, initial encounter for closed fracture: Secondary | ICD-10-CM | POA: Diagnosis not present

## 2023-11-29 DIAGNOSIS — Z7901 Long term (current) use of anticoagulants: Secondary | ICD-10-CM | POA: Insufficient documentation

## 2023-11-29 DIAGNOSIS — S0990XA Unspecified injury of head, initial encounter: Secondary | ICD-10-CM | POA: Diagnosis present

## 2023-11-29 LAB — COMPREHENSIVE METABOLIC PANEL WITH GFR
ALT: 14 U/L (ref 0–44)
AST: 21 U/L (ref 15–41)
Albumin: 3.1 g/dL — ABNORMAL LOW (ref 3.5–5.0)
Alkaline Phosphatase: 82 U/L (ref 38–126)
Anion gap: 7 (ref 5–15)
BUN: 20 mg/dL (ref 8–23)
CO2: 25 mmol/L (ref 22–32)
Calcium: 8.7 mg/dL — ABNORMAL LOW (ref 8.9–10.3)
Chloride: 108 mmol/L (ref 98–111)
Creatinine, Ser: 1.18 mg/dL — ABNORMAL HIGH (ref 0.44–1.00)
GFR, Estimated: 44 mL/min — ABNORMAL LOW (ref >60)
Glucose, Bld: 118 mg/dL — ABNORMAL HIGH (ref 70–99)
Potassium: 3.6 mmol/L (ref 3.5–5.1)
Sodium: 140 mmol/L (ref 135–145)
Total Bilirubin: 0.6 mg/dL (ref 0.0–1.2)
Total Protein: 6.8 g/dL (ref 6.5–8.1)

## 2023-11-29 LAB — CBC
HCT: 40 % (ref 36.0–46.0)
Hemoglobin: 12.9 g/dL (ref 12.0–15.0)
MCH: 28.9 pg (ref 26.0–34.0)
MCHC: 32.3 g/dL (ref 30.0–36.0)
MCV: 89.5 fL (ref 80.0–100.0)
Platelets: 183 10*3/uL (ref 150–400)
RBC: 4.47 MIL/uL (ref 3.87–5.11)
RDW: 13.5 % (ref 11.5–15.5)
WBC: 7.7 10*3/uL (ref 4.0–10.5)
nRBC: 0 % (ref 0.0–0.2)

## 2023-11-29 LAB — I-STAT CHEM 8, ED
BUN: 23 mg/dL (ref 8–23)
Calcium, Ion: 1.09 mmol/L — ABNORMAL LOW (ref 1.15–1.40)
Chloride: 108 mmol/L (ref 98–111)
Creatinine, Ser: 1.2 mg/dL — ABNORMAL HIGH (ref 0.44–1.00)
Glucose, Bld: 116 mg/dL — ABNORMAL HIGH (ref 70–99)
HCT: 38 % (ref 36.0–46.0)
Hemoglobin: 12.9 g/dL (ref 12.0–15.0)
Potassium: 3.8 mmol/L (ref 3.5–5.1)
Sodium: 142 mmol/L (ref 135–145)
TCO2: 24 mmol/L (ref 22–32)

## 2023-11-29 MED ORDER — FENTANYL CITRATE PF 50 MCG/ML IJ SOSY
50.0000 ug | PREFILLED_SYRINGE | Freq: Once | INTRAMUSCULAR | Status: AC
  Administered 2023-11-29: 50 ug via INTRAVENOUS

## 2023-11-29 MED ORDER — ONDANSETRON 4 MG PO TBDP: 4.0000 mg | ORAL_TABLET | Freq: Three times a day (TID) | ORAL | 0 refills | Status: DC | PRN

## 2023-11-29 MED ORDER — FENTANYL CITRATE PF 50 MCG/ML IJ SOSY
PREFILLED_SYRINGE | INTRAMUSCULAR | Status: AC
  Filled 2023-11-29: qty 1

## 2023-11-29 MED ORDER — ONDANSETRON HCL 4 MG/2ML IJ SOLN
4.0000 mg | Freq: Once | INTRAMUSCULAR | Status: AC
  Administered 2023-11-29: 4 mg via INTRAVENOUS

## 2023-11-29 MED ORDER — ONDANSETRON HCL 4 MG/2ML IJ SOLN
INTRAMUSCULAR | Status: AC
  Filled 2023-11-29: qty 2

## 2023-11-29 NOTE — ED Notes (Signed)
 Patient transported to CT

## 2023-11-29 NOTE — ED Provider Notes (Signed)
 [facility] Emergency Department Provider Note MRN:  [id number]  Arrival date & time: 11/29/23     Chief Complaint   Fall   History of Present Illness   Kerri Sosa is a 89 y.o. year-old female presents to the ED with chief complaint of fall on thinners. She states that she got up to use the bathroom this morning and tripped and fell landing on her face.  She is anticoagulated on eliquis  and arrives as a level 2 trauma.  She complains of headache and neck pain.  She denies chest pain, SOB, abdominal pain, or pelvic pain.  She denies LOC.  No interventions from EMS.  History provided by patient.   Review of Systems  Pertinent positive and negative review of systems noted in HPI.    Physical Exam   Vitals:   11/29/23 0505 11/29/23 0600  BP: 96/81 106/73  Pulse: 81 81  Resp: 15 19  Temp:    SpO2: 90% 94%    CONSTITUTIONAL:  frail-appearing, NAD NEURO:  Alert and oriented x 3, CN 3-12 grossly intact EYES:  eyes equal and reactive ENT/NECK:  Supple, no stridor, large hematoma to forehead CARDIO:  normal rate, regular rhythm, appears well-perfused  PULM:  No respiratory distress, CTAB GI/GU:  non-distended, no focal abdominal pain MSK/SPINE:  No gross deformities, no edema, moves all extremities  SKIN:  no rash, atraumatic, skin tear to right hand   *Additional and/or pertinent findings included in MDM below  Diagnostic and Interventional Summary    EKG Interpretation Date/Time:    Ventricular Rate:    PR Interval:    QRS Duration:    QT Interval:    QTC Calculation:   R Axis:      Text Interpretation:         Labs Reviewed  COMPREHENSIVE METABOLIC PANEL WITH GFR - Abnormal; Notable for the following components:      Result Value   Glucose, Bld 118 (*)    Creatinine, Ser 1.18 (*)    Calcium 8.7 (*)    Albumin 3.1 (*)    GFR, Estimated 44 (*)    All other components within normal limits  I-STAT CHEM 8, ED - Abnormal; Notable for  the following components:   Creatinine, Ser 1.20 (*)    Glucose, Bld 116 (*)    Calcium, Ion 1.09 (*)    All other components within normal limits  CBC  URINALYSIS, ROUTINE W REFLEX MICROSCOPIC    CT CERVICAL SPINE WO CONTRAST  Final Result  Addendum (preliminary) 1 of 1  ADDENDUM REPORT: 11/29/2023 05:50    ADDENDUM:  Study discussed by telephone with PA Marlene Pfluger on 11/29/2023 at  0540 hours.      Electronically Signed    By: Marlise Simpers M.D.    On: 11/29/2023 05:50      Final    CT HEAD WO CONTRAST  Final Result    CT MAXILLOFACIAL WO CONTRAST  Final Result    DG Chest Port 1 View  Final Result    DG Pelvis Portable  Final Result      Medications  fentaNYL (SUBLIMAZE) injection 50 mcg (50 mcg Intravenous Given 11/29/23 0452)  ondansetron  (ZOFRAN ) injection 4 mg (4 mg Intravenous Given 11/29/23 0456)     Procedures  /  Critical Care Procedures  ED Course and Medical Decision Making  I have reviewed the triage vital signs, the nursing notes, and pertinent available records from the EMR.  Social  Determinants Affecting Complexity of Care: Patient has no clinically significant social determinants affecting this chief complaint..   ED Course:    Medical Decision Making Patient brought in as a level 2 trauma for fall on thinners.  She got up to use the bathroom, stumbled, and fell striking her forehead on the ground.  ABCs are intact.  She does have a large hematoma to the forehead.  There are no lacerations, no active bleeding.  She does have a minor skin tear to the right hand, bleeding is controlled.  I received phone call from radiology, who informs me that patient has a type III odontoid fracture.  Recommendation is for a hard collar and for follow-up in 2 weeks time.  I discussed the results with the patient and her daughters.  We discussed concussion precautions.  She appears stable for discharge and outpatient follow-up.  Amount and/or Complexity of  Data Reviewed Labs: ordered. Radiology: ordered.  Risk Prescription drug management.         Consultants: @0557  I consulted with neurosurgery APP Meyran, who recommends hard collar and follow-up in the office in 2 weeks.   Treatment and Plan: I considered admission due to patient's initial presentation, but after considering the examination and diagnostic results, patient will not require admission and can be discharged with outpatient follow-up.    Final Clinical Impressions(s) / ED Diagnoses     ICD-10-CM   1. Fall, initial encounter  W19.XXXA     2. Closed odontoid fracture, initial encounter (HCC)  S12.100A     3. Skin tear of hand without complication, right, initial encounter  S61.411A     4. Injury of head, initial encounter  S09.90XA     5. Concussion with unknown loss of consciousness status, initial encounter  S06.0XAA       ED Discharge Orders          Ordered    ondansetron  (ZOFRAN -ODT) 4 MG disintegrating tablet  Every 8 hours PRN        11/29/23 0620              Discharge Instructions Discussed with and Provided to Patient:     Discharge Instructions      Please wear the collar 24/7 until you follow-up with the neurosurgeon.  Return for new or worsening symptoms.      Sherel Dikes, PA-C 11/29/23 1610    Edson Graces, MD 11/29/23 [phone]

## 2023-11-29 NOTE — Discharge Instructions (Addendum)
 Please wear the collar 24/7 until you follow-up with the neurosurgeon.  Return for new or worsening symptoms.

## 2023-11-29 NOTE — ED Notes (Addendum)
 Trauma Response Nurse Documentation   Kerri Sosa is a 89 y.o. female arriving to [facility] ED via EMS  On Eliquis  (apixaban ) daily. Trauma was activated as a Level 2 by ED charge RN based on the following trauma criteria Elderly patients > 65 with head trauma on anti-coagulation (excluding ASA).  Patient cleared for CT by Thayer Finders PA-C. Pt transported to CT with trauma response nurse present to monitor. RN remained with the patient throughout their absence from the department for clinical observation.   GCS 15.  History   Past Medical History:  Diagnosis Date   Asthma    Crohn's disease (HCC)    Heart attack (HCC)    Hiatal hernia    Hypertension      Past Surgical History:  Procedure Laterality Date   ABDOMINAL HYSTERECTOMY     APPENDECTOMY     HERNIA REPAIR         Initial Focused Assessment (If applicable, or please see trauma documentation): Alert/oriented female presents via EMS from home after a ground level fall, states she was trying to go to the bathroom. Hematoma to left anterior head with associated abrasion, knot to right posterior head that pt reports is very painful.  Airway patent, BS clear No obvious uncontrolled hemorrhage GCS 15 PERRLA 3  CT's Completed:   CT Head and CT C-Spine   Interventions:  IV start and trauma lab draw Portable chest and pelvis XRAY CT head and c-spine Fentanyl for pain control   Plan for disposition:  Discharge home  Consults completed:  Neurosurgery Meyran paged at 0550, call returned [phone]  Event Summary: Alert/oriented female presents via EMS from home after a fall while attempting to walk to bathroom, states she isn't sure what happened. Hematoma to left side of her forehead, right posterior head. Abrasion to forehead. On eliquis  for AFIB. Reports pain in the back of her head. Fentanyl given for pain control, which dropped pt's BP to 80s/90s. Improved on recheck. Trauma scans with odontoid fx, see imaging tab.  Neurosurgery consulted, plan for hard collar and D/C home.   Bedside handoff with ED RN Gregory Leash.    Ostin Mathey O Gursimran Litaker  Trauma Response RN  Please call TRN at [phone] for further assistance.

## 2023-11-29 NOTE — ED Triage Notes (Signed)
 Patient BIB [facility] from home due to a fall. Patient was attempting to go to the bathroom when she tripped and fell hitting her head. Patient takes eliquis . Patient has knot to left side of forehead and hematoma to back of head. Skin tear noted to right hand. Patient denies LOC or neck pain. Head pain 6/10. VSS w/ EMS. A&Ox4.

## 2023-12-19 ENCOUNTER — Other Ambulatory Visit: Payer: Self-pay | Admitting: Cardiovascular Disease

## 2024-01-08 ENCOUNTER — Other Ambulatory Visit (HOSPITAL_COMMUNITY): Payer: Self-pay | Admitting: Neurological Surgery

## 2024-01-08 DIAGNOSIS — S12100G Unspecified displaced fracture of second cervical vertebra, subsequent encounter for fracture with delayed healing: Secondary | ICD-10-CM

## 2024-01-13 ENCOUNTER — Other Ambulatory Visit: Payer: Self-pay | Admitting: Nurse Practitioner

## 2024-01-29 ENCOUNTER — Other Ambulatory Visit: Payer: Self-pay | Admitting: Cardiovascular Disease

## 2024-01-29 NOTE — Telephone Encounter (Signed)
 Prescription refill request for Eliquis  received. Indication: afib  Last office visit: Nahser, 11/06/2023 Scr: 1.33, 01/26/2024 Age: 89 yo  Weight: 69.4 kg   Refill sent.

## 2024-01-30 ENCOUNTER — Observation Stay (HOSPITAL_BASED_OUTPATIENT_CLINIC_OR_DEPARTMENT_OTHER)
Admission: EM | Admit: 2024-01-30 | Discharge: 2024-01-31 | Disposition: A | Discharge status: Home / Self Care | Attending: Internal Medicine | Admitting: Internal Medicine

## 2024-01-30 ENCOUNTER — Encounter (HOSPITAL_BASED_OUTPATIENT_CLINIC_OR_DEPARTMENT_OTHER): Payer: Self-pay

## 2024-01-30 ENCOUNTER — Emergency Department (HOSPITAL_BASED_OUTPATIENT_CLINIC_OR_DEPARTMENT_OTHER)

## 2024-01-30 ENCOUNTER — Other Ambulatory Visit: Payer: Self-pay

## 2024-01-30 ENCOUNTER — Telehealth: Payer: Self-pay | Admitting: Cardiovascular Disease

## 2024-01-30 DIAGNOSIS — Z959 Presence of cardiac and vascular implant and graft, unspecified: Secondary | ICD-10-CM | POA: Diagnosis not present

## 2024-01-30 DIAGNOSIS — X58XXXD Exposure to other specified factors, subsequent encounter: Secondary | ICD-10-CM | POA: Diagnosis not present

## 2024-01-30 DIAGNOSIS — I2510 Atherosclerotic heart disease of native coronary artery without angina pectoris: Secondary | ICD-10-CM | POA: Diagnosis not present

## 2024-01-30 DIAGNOSIS — N1832 Chronic kidney disease, stage 3b: Secondary | ICD-10-CM | POA: Diagnosis not present

## 2024-01-30 DIAGNOSIS — S12121D Other nondisplaced dens fracture, subsequent encounter for fracture with routine healing: Secondary | ICD-10-CM | POA: Diagnosis not present

## 2024-01-30 DIAGNOSIS — I4819 Other persistent atrial fibrillation: Secondary | ICD-10-CM | POA: Insufficient documentation

## 2024-01-30 DIAGNOSIS — J45909 Unspecified asthma, uncomplicated: Secondary | ICD-10-CM | POA: Diagnosis not present

## 2024-01-30 DIAGNOSIS — L03115 Cellulitis of right lower limb: Secondary | ICD-10-CM | POA: Diagnosis not present

## 2024-01-30 DIAGNOSIS — I129 Hypertensive chronic kidney disease with stage 1 through stage 4 chronic kidney disease, or unspecified chronic kidney disease: Secondary | ICD-10-CM | POA: Diagnosis not present

## 2024-01-30 DIAGNOSIS — I872 Venous insufficiency (chronic) (peripheral): Secondary | ICD-10-CM | POA: Insufficient documentation

## 2024-01-30 DIAGNOSIS — R600 Localized edema: Secondary | ICD-10-CM | POA: Diagnosis present

## 2024-01-30 LAB — COMPREHENSIVE METABOLIC PANEL WITH GFR
ALT: 8 U/L (ref 0–44)
AST: 18 U/L (ref 15–41)
Albumin: 3.9 g/dL (ref 3.5–5.0)
Alkaline Phosphatase: 113 U/L (ref 38–126)
Anion gap: 13 (ref 5–15)
BUN: 33 mg/dL — ABNORMAL HIGH (ref 8–23)
CO2: 26 mmol/L (ref 22–32)
Calcium: 9.1 mg/dL (ref 8.9–10.3)
Chloride: 103 mmol/L (ref 98–111)
Creatinine, Ser: 1.5 mg/dL — ABNORMAL HIGH (ref 0.44–1.00)
GFR, Estimated: 33 mL/min — ABNORMAL LOW (ref >60)
Glucose, Bld: 107 mg/dL — ABNORMAL HIGH (ref 70–99)
Potassium: 4.1 mmol/L (ref 3.5–5.1)
Sodium: 141 mmol/L (ref 135–145)
Total Bilirubin: 0.5 mg/dL (ref 0.0–1.2)
Total Protein: 7.4 g/dL (ref 6.5–8.1)

## 2024-01-30 LAB — CBC WITH DIFFERENTIAL/PLATELET
Abs Immature Granulocytes: 0.01 K/uL (ref 0.00–0.07)
Basophils Absolute: 0.1 K/uL (ref 0.0–0.1)
Basophils Relative: 1 %
Eosinophils Absolute: 0.1 K/uL (ref 0.0–0.5)
Eosinophils Relative: 1 %
HCT: 40.5 % (ref 36.0–46.0)
Hemoglobin: 13.2 g/dL (ref 12.0–15.0)
Immature Granulocytes: 0 %
Lymphocytes Relative: 10 %
Lymphs Abs: 0.8 K/uL (ref 0.7–4.0)
MCH: 29.3 pg (ref 26.0–34.0)
MCHC: 32.6 g/dL (ref 30.0–36.0)
MCV: 89.8 fL (ref 80.0–100.0)
Monocytes Absolute: 0.7 K/uL (ref 0.1–1.0)
Monocytes Relative: 9 %
Neutro Abs: 6.9 K/uL (ref 1.7–7.7)
Neutrophils Relative %: 79 %
Platelets: 214 K/uL (ref 150–400)
RBC: 4.51 MIL/uL (ref 3.87–5.11)
RDW: 13.5 % (ref 11.5–15.5)
WBC: 8.6 K/uL (ref 4.0–10.5)
nRBC: 0 % (ref 0.0–0.2)

## 2024-01-30 MED ORDER — PANTOPRAZOLE SODIUM 40 MG PO TBEC
40.0000 mg | DELAYED_RELEASE_TABLET | Freq: Every day | ORAL | Status: DC
  Administered 2024-01-31: 40 mg via ORAL
  Filled 2024-01-30: qty 1

## 2024-01-30 MED ORDER — PRAVASTATIN SODIUM 40 MG PO TABS: 40.0000 mg | ORAL_TABLET | ORAL | Status: DC

## 2024-01-30 MED ORDER — PRAVASTATIN SODIUM 40 MG PO TABS
40.0000 mg | ORAL_TABLET | ORAL | Status: DC
  Administered 2024-01-30: 40 mg via ORAL
  Filled 2024-01-30: qty 1

## 2024-01-30 MED ORDER — POTASSIUM CHLORIDE CRYS ER 20 MEQ PO TBCR
20.0000 meq | EXTENDED_RELEASE_TABLET | Freq: Every day | ORAL | Status: DC
  Administered 2024-01-31: 20 meq via ORAL
  Filled 2024-01-30: qty 1

## 2024-01-30 MED ORDER — VANCOMYCIN HCL IN DEXTROSE 1-5 GM/200ML-% IV SOLN
1000.0000 mg | Freq: Once | INTRAVENOUS | Status: AC
  Administered 2024-01-30: 1000 mg via INTRAVENOUS
  Filled 2024-01-30: qty 200

## 2024-01-30 MED ORDER — LORATADINE 10 MG PO TABS
10.0000 mg | ORAL_TABLET | Freq: Every day | ORAL | Status: DC
  Administered 2024-01-31: 10 mg via ORAL
  Filled 2024-01-30: qty 1

## 2024-01-30 MED ORDER — FLUTICASONE FUROATE-VILANTEROL 100-25 MCG/ACT IN AEPB
1.0000 | INHALATION_SPRAY | Freq: Every day | RESPIRATORY_TRACT | Status: DC
  Administered 2024-01-31: 1 via RESPIRATORY_TRACT
  Filled 2024-01-30: qty 28

## 2024-01-30 MED ORDER — EZETIMIBE 10 MG PO TABS
10.0000 mg | ORAL_TABLET | Freq: Every day | ORAL | Status: DC
  Administered 2024-01-31: 10 mg via ORAL
  Filled 2024-01-30: qty 1

## 2024-01-30 MED ORDER — IRBESARTAN 150 MG PO TABS
150.0000 mg | ORAL_TABLET | Freq: Every day | ORAL | Status: DC
  Administered 2024-01-31: 150 mg via ORAL
  Filled 2024-01-30: qty 1

## 2024-01-30 MED ORDER — APIXABAN 5 MG PO TABS
5.0000 mg | ORAL_TABLET | Freq: Two times a day (BID) | ORAL | Status: DC
  Administered 2024-01-30 – 2024-01-31 (×2): 5 mg via ORAL
  Filled 2024-01-30 (×2): qty 1

## 2024-01-30 MED ORDER — AMLODIPINE BESYLATE-VALSARTAN 5-160 MG PO TABS: 1.0000 | ORAL_TABLET | Freq: Every day | ORAL | Status: DC

## 2024-01-30 MED ORDER — BISOPROLOL FUMARATE 5 MG PO TABS
5.0000 mg | ORAL_TABLET | Freq: Every day | ORAL | Status: DC
  Administered 2024-01-31: 5 mg via ORAL
  Filled 2024-01-30: qty 1

## 2024-01-30 MED ORDER — AMLODIPINE BESYLATE 5 MG PO TABS
5.0000 mg | ORAL_TABLET | Freq: Every day | ORAL | Status: DC
  Administered 2024-01-31: 5 mg via ORAL
  Filled 2024-01-30: qty 1

## 2024-01-30 MED ORDER — LINEZOLID 600 MG/300ML IV SOLN: 600.0000 mg | Freq: Two times a day (BID) | INTRAVENOUS | Status: DC

## 2024-01-30 NOTE — ED Notes (Signed)
 ED Provider at bedside.

## 2024-01-30 NOTE — Progress Notes (Signed)
 ED Pharmacy Antibiotic Sign Off An antibiotic consult was received from an ED provider for vancomycin per pharmacy dosing for cellulitis. A chart review was completed to assess appropriateness.   The following one time order(s) were placed:  Vancomycin 1g IV x 1  Further antibiotic and/or antibiotic pharmacy consults should be ordered by the admitting provider if indicated.   Thank you for allowing pharmacy to be a part of this patient's care.   Bradie Lacock, Vernell Helling, [facility]  Clinical Pharmacist 01/30/24 2:21 PM

## 2024-01-30 NOTE — Telephone Encounter (Signed)
 Pt c/o swelling/edema: STAT if pt has developed SOB within 24 hours  If swelling, where is the swelling located? Both legs  How much weight have you gained and in what time span? NA  Have you gained 2 pounds in a day or 5 pounds in a week? No  Do you have a log of your daily weights (if so, list)? No  Are you currently taking a fluid pill? Yes  Are you currently SOB? No  Have you traveled recently in a car or plane for an extended period of time? No Daughter states that both of pt's legs are swollen and red. Right leg has started oozing, per daughter PCP prescribed antibiotic that pt just started. Daughter would like a c/b regarding this matter. Please advise

## 2024-01-30 NOTE — ED Notes (Signed)
 [facility] at bedside

## 2024-01-30 NOTE — ED Notes (Signed)
 2 sets of Blood cultures drawn before starting antibiotics.

## 2024-01-30 NOTE — ED Provider Notes (Signed)
 [place] [facility] AT [facility] Provider Note   CSN: [id number] Arrival date & time: 01/30/24  1200     Patient presents with: Cellulitis (RLE)   Kerri Sosa is a 89 y.o. female.   HPI      89 year old female with a history of Crohn's disease, AS, mitral regurgitation, CAD,  asthma, hypertension, recent visit with family medicine for right leg swelling and erythema who presents with concern for continued leg swelling and erythema.  Her primary care physician saw her on 14 July and started her on cefadroxil  to treat cellulitis.  They recommended elevating her legs and wearing compression stockings.  She was also advised to hold the hydrochlorothiazide  and start Lasix 20 mg twice daily for 3 days   Went on Monday to the doctor, was given the abx for 5 days Swelling started last weekend, redness began Monday AM Is about the same redness now, maybe darker on the foot Foot kept her awake all night like a cattle prod No fever, leg was hot No nausea or vomiting No chest pain, no shortness of breath, no dizziness or lightheaded.  Usually needs to pause when goi nfrom sitting to standing.  Past Medical History:  Diagnosis Date   Asthma    Crohn's disease (HCC)    Heart attack (HCC)    Hiatal hernia    Hypertension      Prior to Admission medications   Medication Sig Start Date End Date Taking? Authorizing Provider  amLODipine -valsartan  (EXFORGE ) 5-160 MG tablet TAKE ONE TABLET BY MOUTH EVERY DAY Patient taking differently: Take 1 tablet by mouth in the morning. 01/13/24  Yes Nahser, Aleene PARAS, MD  bisoprolol  (ZEBETA ) 5 MG tablet Take 1 tablet (5 mg total) by mouth daily. Patient taking differently: Take 5 mg by mouth in the morning. 07/07/19  Yes Darlean Ozell NOVAK, MD  budesonide -formoterol  (SYMBICORT ) 80-4.5 MCG/ACT inhaler Inhale 2 puffs into the lungs in the morning and at bedtime. 07/02/22  Yes [provider]  cefadroxil  (DURICEF) 500 MG capsule  Take 500 mg by mouth 2 (two) times daily. 01/26/24 01/31/24 Yes [provider]  ELIQUIS  5 MG TABS tablet TAKE 1 TABLET BY MOUTH TWICE A DAY 01/29/24  Yes Swinyer, Rosaline HERO, NP  ezetimibe  (ZETIA ) 10 MG tablet Take 1 tablet (10 mg total) by mouth daily. Patient taking differently: Take 10 mg by mouth in the morning. 12/22/23  Yes Nahser, Aleene PARAS, MD  famotidine (PEPCID) 20 MG tablet Take 20 mg by mouth in the morning.   Yes [provider]  hydrochlorothiazide  (HYDRODIURIL ) 25 MG tablet Take 1 tablet (25 mg total) by mouth daily. Patient taking differently: Take 25 mg by mouth in the morning. 03/25/23  Yes Nahser, Aleene PARAS, MD  ibuprofen (ADVIL,MOTRIN) 200 MG tablet Take 200 mg by mouth every 6 (six) hours as needed.   Yes [provider]  loratadine  (CLARITIN ) 10 MG tablet Take 10 mg by mouth in the morning.   Yes [provider]  nitroGLYCERIN  (NITROSTAT ) 0.4 MG SL tablet Place 1 tablet (0.4 mg total) under the tongue every 5 (five) minutes as needed for chest pain. Max three doses. 02/18/23  Yes Nahser, Aleene PARAS, MD  pantoprazole  (PROTONIX ) 40 MG tablet Take 1 tablet (40 mg total) by mouth daily before breakfast. 05/18/18  Yes Darlean Ozell NOVAK, MD  potassium chloride  SA (KLOR-CON  M) 20 MEQ tablet TAKE ONE TABLET BY MOUTH EVERY DAY Patient taking differently: Take 20 mEq by mouth in  the morning. 05/30/23  Yes Nahser, Aleene PARAS, MD  pravastatin  (PRAVACHOL ) 40 MG tablet Take 1 tablet by mouth as directed. 3x a week, M,W,F only at night 09/14/19  Yes [provider]    Allergies: Lipitor [atorvastatin]    Review of Systems  Updated Vital Signs BP 96/70 (BP Location: Right Arm)   Pulse 84   Temp 97.6 F (36.4 C)   Resp 17   Ht 5' 2 (1.575 m)   Wt 69.4 kg   SpO2 97%   BMI 27.98 kg/m   Physical Exam Vitals and nursing note reviewed.  Constitutional:      General: She is not in acute distress.    Appearance: She is well-developed. She is not  diaphoretic.  HENT:     Head: Normocephalic and atraumatic.  Eyes:     Conjunctiva/sclera: Conjunctivae normal.  Cardiovascular:     Rate and Rhythm: Normal rate and regular rhythm.     Heart sounds: Normal heart sounds. No murmur heard.    No friction rub. No gallop.  Pulmonary:     Effort: Pulmonary effort is normal. No respiratory distress.     Breath sounds: Normal breath sounds. No wheezing or rales.  Abdominal:     General: There is no distension.     Palpations: Abdomen is soft.     Tenderness: There is no abdominal tenderness. There is no guarding.  Musculoskeletal:        General: Swelling (RLE) present. No tenderness.     Cervical back: Normal range of motion.  Skin:    General: Skin is warm and dry.     Findings: Erythema (RLE) present. No rash.  Neurological:     Mental Status: She is alert and oriented to person, place, and time.     (all labs ordered are listed, but only abnormal results are displayed) Labs Reviewed  COMPREHENSIVE METABOLIC PANEL WITH GFR - Abnormal; Notable for the following components:      Result Value   Glucose, Bld 107 (*)    BUN 33 (*)    Creatinine, Ser 1.50 (*)    GFR, Estimated 33 (*)    All other components within normal limits  CULTURE, BLOOD (ROUTINE X 2)  CULTURE, BLOOD (ROUTINE X 2)  CBC WITH DIFFERENTIAL/PLATELET    EKG: EKG Interpretation Date/Time:  Friday January 30 2024 15:27:45 EDT Ventricular Rate:  78 PR Interval:    QRS Duration:  93 QT Interval:  384 QTC Calculation: 438 R Axis:   93  Text Interpretation: Atrial fibrillation Right axis deviation No significant change since last tracing Confirmed by Dreama Longs ([id number]) on 01/30/2024 3:45:28 PM  Radiology: US  Venous Img Lower Right (DVT Study) Result Date: 01/30/2024 CLINICAL DATA:  Right leg cellulitis a week ago with persistent redness and swelling EXAM: Right LOWER EXTREMITY VENOUS DOPPLER ULTRASOUND TECHNIQUE: Gray-scale sonography with graded  compression, as well as color Doppler and duplex ultrasound were performed to evaluate the lower extremity deep venous systems from the level of the common femoral vein and including the common femoral, femoral, profunda femoral, popliteal and calf veins including the posterior tibial, peroneal and gastrocnemius veins when visible. The superficial great saphenous vein was also interrogated. Spectral Doppler was utilized to evaluate flow at rest and with distal augmentation maneuvers in the common femoral, femoral and popliteal veins. COMPARISON:  None Available. FINDINGS: Contralateral Common Femoral Vein: Respiratory phasicity is normal and symmetric with the symptomatic side. No evidence of thrombus. Normal compressibility. Common Femoral  Vein: No evidence of thrombus. Normal compressibility, respiratory phasicity and response to augmentation. Saphenofemoral Junction: No evidence of thrombus. Normal compressibility and flow on color Doppler imaging. Profunda Femoral Vein: No evidence of thrombus. Normal compressibility and flow on color Doppler imaging. Femoral Vein: No evidence of thrombus. Normal compressibility, respiratory phasicity and response to augmentation. Popliteal Vein: No evidence of thrombus. Normal compressibility, respiratory phasicity and response to augmentation. Calf Veins: No evidence of thrombus. Normal compressibility and flow on color Doppler imaging. Superficial Great Saphenous Vein: No evidence of thrombus. Normal compressibility. Venous Reflux:  None. Other Findings: Few prominent inguinal lymph nodes identified but not pathologic by size criteria. There is a oblong slightly complex fluid collection identified in the popliteal fossa medially measuring 6.7 x 5.7 x 2.2 cm. No abnormal blood flow on Doppler. IMPRESSION: No evidence of right lower extremity DVT. Complex fluid collection in the popliteal fossa measuring 6.7 x 5.7 x 2.2 cm. Possibilities include a Baker's cyst but there is a  differential. Please correlate with history and further workup as clinically appropriate. Few prominent inguinal lymph nodes. These could be reactive. Clinical follow-up. Electronically Signed   By: Ranell Bring M.D.   On: 01/30/2024 16:00     Procedures   Medications Ordered in the ED  apixaban  (ELIQUIS ) tablet 5 mg (5 mg Oral Given 01/30/24 2202)  ezetimibe  (ZETIA ) tablet 10 mg (has no administration in time range)  loratadine  (CLARITIN ) tablet 10 mg (has no administration in time range)  pantoprazole  (PROTONIX ) EC tablet 40 mg (has no administration in time range)  potassium chloride  SA (KLOR-CON  M) CR tablet 20 mEq (has no administration in time range)  bisoprolol  (ZEBETA ) tablet 5 mg (has no administration in time range)  fluticasone  furoate-vilanterol (BREO ELLIPTA ) 100-25 MCG/ACT 1 puff (has no administration in time range)  linezolid  (ZYVOX ) IVPB 600 mg (has no administration in time range)  pravastatin  (PRAVACHOL ) tablet 40 mg (40 mg Oral Given 01/30/24 2202)  amLODipine  (NORVASC ) tablet 5 mg (has no administration in time range)    And  irbesartan  (AVAPRO ) tablet 150 mg (has no administration in time range)  vancomycin  (VANCOCIN ) IVPB 1000 mg/200 mL premix (0 mg Intravenous Stopped 01/30/24 1649)                                      89 year old female with a history of Crohn's disease, AS, mitral regurgitation, CAD, atrial fibrillation, asthma, hypertension, recent visit with family medicine for right leg swelling and erythema diagnosed as cellulitis who presents with concern for continued leg swelling and erythema despite antibiotics.  Has normal pulses, no sign of acute arterial thrombus.  DVT study ordered. No sign of abscess, necrotizing infection. Had BNP done with PCP that was mildly elevated, presentation not consistent with acute CHF exacerbation to require admission  Has not had improvement to cellulitis of RLE despite more than 48 hours of antibiotics.  Given failure  of outpt abx and comorbidities will admit for IV antibiotics.  Given vancomycin .   Labs evaluated by me without clinically significant change since prior.  No signs of sepsis.      Final diagnoses:  Cellulitis of right lower extremity    ED Discharge Orders     None          Dreama Longs, MD 01/31/24 [phone]

## 2024-01-30 NOTE — Telephone Encounter (Signed)
 Dtr reports they went to her PCP for LEE.  Their note states cellulitis of RLE, have started her on abx and couple days of Lasix.  Dtr confirms pt is not SOB, no CP, dizziness. Aware I can see PCP note and they asked them to call and update our office with pt condition and current plan.  Advised dtr to contact PCP to further discuss pt not improving much (as per their note to contact them if pt not improved after 3 days of Lasix).  Aware that if PCP feels cardiology component involved to contact us  to further discuss.  Dtr is agreeable to plan.

## 2024-01-30 NOTE — ED Triage Notes (Signed)
 Pt c/o RLE redness, oozing onset yesterday. Daughter has concern for BLE leg swelling since Sunday, redness on R but some on L foot too. Recent dx of cellulitis to R leg Monday, daughter advises swelling is better, but it still looks angry.

## 2024-01-30 NOTE — ED Notes (Signed)
Lauren with cl called for transport 

## 2024-01-30 NOTE — H&P (Addendum)
 History and Physical    Kerri Sosa  FMW:[id number]  DOB: July 12, 1931  DOA: 01/30/2024 PCP: Debrah Josette LELON., PA-C   Patient coming from: home   Chief Complaint: Swelling and redness of left lower extremity  HPI: Kerri Sosa is a 89 y.o. female with medical history of asthma, atrial fibrillation, hypertension presents to the hospital for swelling and redness in her left lower extremity.  The patient states that it started on Saturday (about 6 days ago) with swelling and redness in both of her legs.  On Monday she was evaluated by her primary provider, started on cefadroxil and given a fluid pill.  She was also given TED hose which she has been wearing.  She has been attempting to elevate her legs at night.  Swelling in her left leg resolved however she continued to have persistent edema and erythema of her right leg.  After a couple of days, she developed a weeping of fluid from the lower part of the leg.  Last night she developed severe pain that she describes as pins-and-needles sensation in the foot and leg.  Due to lack of improvement on cefadroxil, she presented to the [facility] ED.  She has not had any fevers.  Pain has improved. ED Course: Given vancomycin  Review of Systems:  All other systems reviewed and apart from HPI, are negative.  Past Medical History:  Diagnosis Date   Asthma    Crohn's disease (HCC)    Heart attack (HCC)    Hiatal hernia    Hypertension     Past Surgical History:  Procedure Laterality Date   ABDOMINAL HYSTERECTOMY     APPENDECTOMY     HERNIA REPAIR      Social History:   reports that she has never smoked. She has never used smokeless tobacco. She reports that she does not drink alcohol and does not use drugs.  Allergies  Allergen Reactions   Lipitor [Atorvastatin] Other (See Comments)    ELEVATES LIVER ENZYMES    Family History  Problem Relation Age of Onset   Heart disease Mother    Cancer Father    Breast cancer Sister     Hypertension Daughter    Colon cancer Neg Hx    Stomach cancer Neg Hx    Esophageal cancer Neg Hx    Colon polyps Neg Hx      Prior to Admission medications   Medication Sig Start Date End Date Taking? Authorizing Provider  amLODipine -valsartan  (EXFORGE ) 5-160 MG tablet TAKE ONE TABLET BY MOUTH EVERY DAY 01/13/24   Nahser, Aleene PARAS, MD  bisoprolol  (ZEBETA ) 5 MG tablet Take 1 tablet (5 mg total) by mouth daily. 07/07/19   Darlean Ozell NOVAK, MD  budesonide -formoterol  (SYMBICORT ) 80-4.5 MCG/ACT inhaler Inhale 2 puffs into the lungs daily in the afternoon. 07/02/22   [provider]  ELIQUIS  5 MG TABS tablet TAKE 1 TABLET BY MOUTH TWICE A DAY 01/29/24   Swinyer, Rosaline HERO, NP  ezetimibe  (ZETIA ) 10 MG tablet Take 1 tablet (10 mg total) by mouth daily. 12/22/23   Nahser, Aleene PARAS, MD  famotidine (PEPCID) 20 MG tablet Take 20 mg by mouth at bedtime.    [provider]  hydrochlorothiazide  (HYDRODIURIL ) 25 MG tablet Take 1 tablet (25 mg total) by mouth daily. 03/25/23   Nahser, Aleene PARAS, MD  ibuprofen (ADVIL,MOTRIN) 200 MG tablet Take 200 mg by mouth every 6 (six) hours as needed.    [provider]  loratadine (CLARITIN) 10  MG tablet Take 10 mg by mouth daily.    [provider]  nitroGLYCERIN  (NITROSTAT ) 0.4 MG SL tablet Place 1 tablet (0.4 mg total) under the tongue every 5 (five) minutes as needed for chest pain. Max three doses. 02/18/23   Nahser, Aleene PARAS, MD  ondansetron  (ZOFRAN -ODT) 4 MG disintegrating tablet Take 1 tablet (4 mg total) by mouth every 8 (eight) hours as needed for nausea or vomiting. 11/29/23   Vicky Charleston, PA-C  pantoprazole  (PROTONIX ) 40 MG tablet Take 1 tablet (40 mg total) by mouth daily before breakfast. 05/18/18   Darlean Ozell NOVAK, MD  potassium chloride  SA (KLOR-CON  M) 20 MEQ tablet TAKE ONE TABLET BY MOUTH EVERY DAY 05/30/23   Nahser, Aleene PARAS, MD  pravastatin  (PRAVACHOL ) 40 MG tablet Take 1 tablet by mouth as directed. 3x a week, M,W,F only  09/14/19   [provider]    Physical Exam: Wt Readings from Last 3 Encounters:  01/30/24 69.4 kg  11/29/23 69.4 kg  11/06/23 69.6 kg   Vitals:   01/30/24 1400 01/30/24 1430 01/30/24 1500 01/30/24 1715  BP:  109/77 122/69 127/83  Pulse:  78 97 96  Resp:    18  Temp:    98.1 F (36.7 C)  TempSrc:    Oral  SpO2:  96%  94%  Weight: 69.4 kg     Height: 5' 2 (1.575 m)         Constitutional:  Calm & comfortable Eyes: PERRLA, lids and conjunctivae normal ENT:  Mucous membranes are moist.  Pharynx clear of exudate   Normal dentition.  Respiratory:  Clear to auscultation bilaterally  Normal respiratory effort.  Cardiovascular:  S1 & S2 heard, regular rate and rhythm No Murmurs Abdomen:  Non distended No tenderness, No masses Bowel sounds normal Extremities:  No clubbing / cyanosis Extensive erythema, edema and tenderness of the right foot and leg.  Mild erythema of the left foot and leg without edema. Neurologic:  AAO x 3 CN 2-12 grossly intact Sensation intact Strength 5/5 in all 4 extremities Psychiatric:  Normal Mood and affect    Labs on Admission: I have personally reviewed labs and imaging studies    Assessment/Plan Principal Problem:   Cellulitis of right leg -It appears that she has a component of venous stasis however, also appears to have persistent cellulitis - Lower extremity venous duplex shows a right-sided Baker's cyst and right-sided lymphadenopathy - She received vancomycin in the ED-I have ordered Zyvox twice daily IV - Resume TED hose-have asked RN to elevate her foot on 2 pillows -avoid further diuretics  Persistent atrial fibrillation Hypertension Coronary artery disease status post inferior STEMI and BMS to mid RCA in 2014 - [place] vas score 5 - Continue Eliquis , bisoprolol  and amlodipine /valsartan   Odontoid fracture, nondisplaced - Patient sustained this fracture after a fall in her bedroom on 11/29/2023 - Continue soft  collar-she is able to remove it for showering  CKD stage IIIb   DVT prophylaxis: Eliquis  Code Status: DO NOT RESUSCITATE-discussed with patient in the presence of her daughter-the patient states that she has a living well as well Level of care: Med-Surg  True Atlas MD [facility]    01/30/2024, 6:34 PM

## 2024-01-31 ENCOUNTER — Other Ambulatory Visit (HOSPITAL_COMMUNITY): Payer: Self-pay

## 2024-01-31 DIAGNOSIS — I872 Venous insufficiency (chronic) (peripheral): Secondary | ICD-10-CM | POA: Insufficient documentation

## 2024-01-31 DIAGNOSIS — L03115 Cellulitis of right lower limb: Secondary | ICD-10-CM | POA: Diagnosis not present

## 2024-01-31 MED ORDER — LINEZOLID 600 MG PO TABS
600.0000 mg | ORAL_TABLET | Freq: Two times a day (BID) | ORAL | 0 refills | Status: AC
  Filled 2024-01-31: qty 10, 5d supply, fill #0

## 2024-01-31 MED ORDER — CEFADROXIL 500 MG PO CAPS: 500.0000 mg | ORAL_CAPSULE | Freq: Two times a day (BID) | ORAL | Status: DC

## 2024-01-31 MED ORDER — CEFADROXIL 500 MG PO CAPS
500.0000 mg | ORAL_CAPSULE | Freq: Two times a day (BID) | ORAL | 0 refills | Status: AC
  Filled 2024-01-31: qty 10, 5d supply, fill #0

## 2024-01-31 NOTE — Plan of Care (Signed)
  Problem: Education: Goal: Knowledge of General Education information will improve Description: Including pain rating scale, medication(s)/side effects and non-pharmacologic comfort measures Outcome: Completed/Met   Problem: Health Behavior/Discharge Planning: Goal: Ability to manage health-related needs will improve Outcome: Completed/Met   Problem: Clinical Measurements: Goal: Ability to maintain clinical measurements within normal limits will improve Outcome: Completed/Met Goal: Will remain free from infection Outcome: Completed/Met Goal: Diagnostic test results will improve Outcome: Completed/Met Goal: Respiratory complications will improve Outcome: Completed/Met Goal: Cardiovascular complication will be avoided Outcome: Completed/Met   Problem: Activity: Goal: Risk for activity intolerance will decrease Outcome: Completed/Met   Problem: Nutrition: Goal: Adequate nutrition will be maintained Outcome: Completed/Met   Problem: Coping: Goal: Level of anxiety will decrease Outcome: Completed/Met   Problem: Elimination: Goal: Will not experience complications related to bowel motility Outcome: Completed/Met Goal: Will not experience complications related to urinary retention Outcome: Completed/Met   Problem: Pain Managment: Goal: General experience of comfort will improve and/or be controlled Outcome: Completed/Met   Problem: Safety: Goal: Ability to remain free from injury will improve Outcome: Completed/Met   Problem: Skin Integrity: Goal: Risk for impaired skin integrity will decrease Outcome: Completed/Met   Problem: Clinical Measurements: Goal: Ability to avoid or minimize complications of infection will improve Outcome: Completed/Met   Problem: Skin Integrity: Goal: Skin integrity will improve Outcome: Completed/Met

## 2024-01-31 NOTE — Care Management Obs Status (Signed)
 MEDICARE OBSERVATION STATUS NOTIFICATION   Patient Details  Name: Kerri Sosa MRN: [id number] Date of Birth: 1931-06-06   Medicare Observation Status Notification Given:  Yes    Sonda Manuella Quill, RN 01/31/2024, 11:14 AM

## 2024-01-31 NOTE — TOC Initial Note (Signed)
 Transition of Care [facility]) - Initial/Assessment Note    Patient Details  Name: Kerri Sosa MRN: [id number] Date of Birth: [birth year]  Transition of Care [facility] CM/SW Contact:    Sonda Manuella Quill, RN Phone Number: 01/31/2024, 11:23 AM  Clinical Narrative:                 Kerri Sosa w/ pt in room; pt says she shares a home w/ her dtr Kerri Sosa [phone]); she plans to return at d/c; her dtr will provide transportation; pt verified insurance/PCP; she denied SDOH risks; pt has cane, walker,BSC, and shower chair; she does not have HH services or home oxygen; no TOC needs.  Expected Discharge Plan: Home/Self Care Barriers to Discharge: No Barriers Identified   Patient Goals and CMS Choice Patient states their goals for this hospitalization and ongoing recovery are:: home CMS Medicare.gov Compare Post Acute Care list provided to:: Patient        Expected Discharge Plan and Services   Discharge Planning Services: CM Consult   Living arrangements for the past 2 months: Single Family Home Expected Discharge Date: 01/31/24               DME Arranged: N/A DME Agency: NA       HH Arranged: NA HH Agency: NA        Prior Living Arrangements/Services Living arrangements for the past 2 months: Single Family Home Lives with:: Adult Children Patient language and need for interpreter reviewed:: Yes Do you feel safe going back to the place where you live?: Yes      Need for Family Participation in Patient Care: Yes (Comment) Care giver support system in place?: Yes (comment) Current home services: DME (cane, walker, BSC, shower chair) Criminal Activity/Legal Involvement Pertinent to Current Situation/Hospitalization: No - Comment as needed  Activities of Daily Living   ADL Screening (condition at time of admission) Independently performs ADLs?: Yes (appropriate for developmental age) Is the patient deaf or have difficulty hearing?: No Does the patient have difficulty seeing,  even when wearing glasses/contacts?: Yes (reading) Does the patient have difficulty concentrating, remembering, or making decisions?: No  Permission Sought/Granted Permission sought to share information with : Case Manager Permission granted to share information with : Yes, Verbal Permission Granted  Share Information with NAME: Case Manager     Permission granted to share info w Relationship: Kerri Sosa (dtr) [phone]     Emotional Assessment Appearance:: Appears stated age Attitude/Demeanor/Rapport: Gracious Affect (typically observed): Accepting Orientation: : Oriented to Self, Oriented to Place, Oriented to  Time, Oriented to Situation Alcohol / Substance Use: Not Applicable Psych Involvement: No (comment)  Admission diagnosis:  Cellulitis of right leg [L03.115] Cellulitis of ankle [L03.119] Patient Active Problem List   Diagnosis Date Noted   Cellulitis of ankle 01/31/2024   Cellulitis of right leg 01/30/2024   Atrial fibrillation (HCC) 11/06/2023   Chronic asthma, severe persistent, uncomplicated 06/27/2018   Upper airway cough syndrome 05/16/2018   DOE (dyspnea on exertion) 05/15/2018   Chronic diastolic heart failure (HCC) 06/06/2015   Acute chest pain 06/04/2015   CAD (coronary artery disease) 04/07/2013   Hyperlipidemia 04/07/2013   Essential hypertension 04/07/2013   Aortic stenosis 06/25/2012   Hiatal hernia    STRICTURE AND STENOSIS OF ESOPHAGUS 11/18/2007   CROHN'S DISEASE 11/18/2007   HEMORRHOIDS, EXTERNAL 04/20/2002   DIVERTICULOSIS OF COLON 04/20/2002   GASTRITIS 04/19/2002   DUODENITIS, WITHOUT HEMORRHAGE 04/19/2002   PCP:  Debrah Josette LELON., PA-C Pharmacy:  [facility] - [place], [place] - [address] [place] [address] Phone: [phone] Fax: [phone]  [facility] [phone] - [place], [place] - [address] [place] [address] Phone: [phone] Fax:  [phone]  [facility] [address] [place] [address] Phone: [phone] Fax: [phone]     Social Drivers of Health (SDOH) Social History: SDOH Screenings   Food Insecurity: No Food Insecurity (01/31/2024)  Housing: Low Risk  (01/31/2024)  Transportation Needs: No Transportation Needs (01/31/2024)  Utilities: Not At Risk (01/31/2024)  Financial Resource Strain: Low Risk  (04/18/2022)   Received from [facility], [facility] visits prior to 09/14/2022.  Physical Activity: Insufficiently Active (04/18/2022)   Received from [facility] visits prior to 09/14/2022., [facility]  Social Connections: Moderately Isolated (01/30/2024)  Stress: No Stress Concern Present (04/18/2022)   [facility] from [facility] visits prior to 09/14/2022.  Tobacco Use: Low Risk  (01/30/2024)   SDOH Interventions: Food Insecurity Interventions: Intervention Not Indicated, Inpatient TOC Housing Interventions: Intervention Not Indicated, Inpatient TOC Transportation Interventions: Intervention Not Indicated, Inpatient TOC Utilities Interventions: Intervention Not Indicated, Inpatient TOC   Readmission Risk Interventions     No data to display

## 2024-01-31 NOTE — Care Management CC44 (Signed)
 Condition Code 44 Documentation Completed  Patient Details  Name: Kerri Sosa MRN: [id number] Date of Birth: 05/17/1931   Condition Code 44 given:  Yes Patient signature on Condition Code 44 notice:  Yes Documentation of 2 MD's agreement:  Yes Code 44 added to claim:  Yes    Sonda Manuella Quill, RN 01/31/2024, 11:26 AM

## 2024-01-31 NOTE — Discharge Summary (Signed)
 Physician Discharge Summary  Kerri Sosa FMW:[id number] DOB: [birth year] DOA: 01/30/2024  PCP: Debrah Josette LELON., PA-C  Admit date: 01/30/2024 Discharge date: 01/31/2024   Hospital Course:  Kerri Sosa is a 89 y.o. female with medical history of asthma, atrial fibrillation, hypertension presents to the hospital for swelling and redness in her left lower extremity.  The patient states that it started on Saturday (about 6 days ago) with swelling and redness in both of her legs.  On Monday she was evaluated by her primary provider, started on cefadroxil  and given a fluid pill.  She was also given TED hose which she has been wearing.  She has been attempting to elevate her legs at night.  Swelling in her left leg resolved however she continued to have persistent edema and erythema of her right leg.  After a couple of days, she developed a weeping of fluid from the lower part of the leg.  Last night she developed severe pain that she describes as pins-and-needles sensation in the foot and leg.  Due to lack of improvement on cefadroxil , she [facility].  She has not had any fevers.    Principal Problem:   Cellulitis of right leg -It appears that she has a component of venous stasis however, also appeared to have had persistent cellulitis - Lower extremity venous duplex showed a right-sided Baker's cyst and right-sided lymphadenopathy - She improved with vancomycin , TED hose and elevation   Persistent atrial fibrillation Hypertension Coronary artery disease status post inferior STEMI and BMS to mid RCA in 2014 - [place] vas score 5 - Continue Eliquis , bisoprolol  and amlodipine /valsartan    Odontoid fracture, nondisplaced - Patient sustained this fracture after a fall in her bedroom on 11/29/2023 - Continue soft collar-she is able to remove it for showering   CKD stage IIIb            Discharge Instructions   Allergies as of 01/31/2024       Reactions   Lipitor  [atorvastatin] Other (See Comments)   ELEVATES LIVER ENZYMES        Medication List     STOP taking these medications    hydrochlorothiazide  25 MG tablet Commonly known as: HYDRODIURIL        TAKE these medications    amLODipine -valsartan  5-160 MG tablet Commonly known as: EXFORGE  TAKE ONE TABLET BY MOUTH EVERY DAY What changed: when to take this   bisoprolol  5 MG tablet Commonly known as: ZEBETA  Take 1 tablet (5 mg total) by mouth daily. What changed: when to take this   budesonide -formoterol  80-4.5 MCG/ACT inhaler Commonly known as: SYMBICORT  Inhale 2 puffs into the lungs in the morning and at bedtime.   cefadroxil  500 MG capsule Commonly known as: DURICEF Take 1 capsule (500 mg total) by mouth 2 (two) times daily for 5 days.   Eliquis  5 MG Tabs tablet Generic drug: apixaban  TAKE 1 TABLET BY MOUTH TWICE A DAY   ezetimibe  10 MG tablet Commonly known as: ZETIA  Take 1 tablet (10 mg total) by mouth daily. What changed: when to take this   famotidine 20 MG tablet Commonly known as: PEPCID Take 20 mg by mouth in the morning.   ibuprofen 200 MG tablet Commonly known as: ADVIL Take 200 mg by mouth every 6 (six) hours as needed.   linezolid  600 MG tablet Commonly known as: Zyvox  Take 1 tablet (600 mg total) by mouth 2 (two) times daily for 5 days.   loratadine  10  MG tablet Commonly known as: CLARITIN  Take 10 mg by mouth in the morning.   nitroGLYCERIN  0.4 MG SL tablet Commonly known as: NITROSTAT  Place 1 tablet (0.4 mg total) under the tongue every 5 (five) minutes as needed for chest pain. Max three doses.   pantoprazole  40 MG tablet Commonly known as: Protonix  Take 1 tablet (40 mg total) by mouth daily before breakfast.   potassium chloride  SA 20 MEQ tablet Commonly known as: KLOR-CON  M TAKE ONE TABLET BY MOUTH EVERY DAY What changed: when to take this   pravastatin  40 MG tablet Commonly known as: PRAVACHOL  Take 1 tablet by mouth as directed. 3x  a week, M,W,F only at night            The results of significant diagnostics from this hospitalization (including imaging, microbiology, ancillary and laboratory) are listed below for reference.    US  Venous Img Lower Right (DVT Study) Result Date: 01/30/2024 CLINICAL DATA:  Right leg cellulitis a week ago with persistent redness and swelling EXAM: Right LOWER EXTREMITY VENOUS DOPPLER ULTRASOUND TECHNIQUE: Gray-scale sonography with graded compression, as well as color Doppler and duplex ultrasound were performed to evaluate the lower extremity deep venous systems from the level of the common femoral vein and including the common femoral, femoral, profunda femoral, popliteal and calf veins including the posterior tibial, peroneal and gastrocnemius veins when visible. The superficial great saphenous vein was also interrogated. Spectral Doppler was utilized to evaluate flow at rest and with distal augmentation maneuvers in the common femoral, femoral and popliteal veins. COMPARISON:  None Available. FINDINGS: Contralateral Common Femoral Vein: Respiratory phasicity is normal and symmetric with the symptomatic side. No evidence of thrombus. Normal compressibility. Common Femoral Vein: No evidence of thrombus. Normal compressibility, respiratory phasicity and response to augmentation. Saphenofemoral Junction: No evidence of thrombus. Normal compressibility and flow on color Doppler imaging. Profunda Femoral Vein: No evidence of thrombus. Normal compressibility and flow on color Doppler imaging. Femoral Vein: No evidence of thrombus. Normal compressibility, respiratory phasicity and response to augmentation. Popliteal Vein: No evidence of thrombus. Normal compressibility, respiratory phasicity and response to augmentation. Calf Veins: No evidence of thrombus. Normal compressibility and flow on color Doppler imaging. Superficial Great Saphenous Vein: No evidence of thrombus. Normal compressibility. Venous  Reflux:  None. Other Findings: Few prominent inguinal lymph nodes identified but not pathologic by size criteria. There is a oblong slightly complex fluid collection identified in the popliteal fossa medially measuring 6.7 x 5.7 x 2.2 cm. No abnormal blood flow on Doppler. IMPRESSION: No evidence of right lower extremity DVT. Complex fluid collection in the popliteal fossa measuring 6.7 x 5.7 x 2.2 cm. Possibilities include a Baker's cyst but there is a differential. Please correlate with history and further workup as clinically appropriate. Few prominent inguinal lymph nodes. These could be reactive. Clinical follow-up. Electronically Signed   By: Ranell Bring M.D.   On: 01/30/2024 16:00   Labs:   Basic Metabolic Panel: Recent Labs  Lab 01/30/24 1212  NA 141  K 4.1  CL 103  CO2 26  GLUCOSE 107*  BUN 33*  CREATININE 1.50*  CALCIUM 9.1     CBC: Recent Labs  Lab 01/30/24 1212  WBC 8.6  NEUTROABS 6.9  HGB 13.2  HCT 40.5  MCV 89.8  PLT 214         SIGNED:   True Atlas, MD  [facility] 01/31/2024, 5:07 PM Time taking on discharge: 50 minutes

## 2024-02-04 LAB — CULTURE, BLOOD (ROUTINE X 2)
Culture: NO GROWTH
Culture: NO GROWTH
Special Requests: ADEQUATE
Special Requests: ADEQUATE

## 2024-03-08 ENCOUNTER — Ambulatory Visit (HOSPITAL_COMMUNITY)
Admission: RE | Admit: 2024-03-08 | Discharge: 2024-03-08 | Disposition: A | Discharge status: Home / Self Care | Source: Ambulatory Visit | Attending: Neurological Surgery | Admitting: Neurological Surgery

## 2024-03-08 DIAGNOSIS — S12100G Unspecified displaced fracture of second cervical vertebra, subsequent encounter for fracture with delayed healing: Secondary | ICD-10-CM | POA: Insufficient documentation

## 2024-03-10 ENCOUNTER — Other Ambulatory Visit: Payer: Self-pay

## 2024-03-10 MED ORDER — POTASSIUM CHLORIDE CRYS ER 20 MEQ PO TBCR
20.0000 meq | EXTENDED_RELEASE_TABLET | Freq: Every day | ORAL | 2 refills | Status: AC
Start: 2024-03-10 — End: ?

## 2024-08-11 ENCOUNTER — Other Ambulatory Visit: Payer: Self-pay | Admitting: Nurse Practitioner
